# Patient Record
Sex: Female | Born: 1965 | Race: White | Hispanic: No | Smoking: Never smoker
Health system: Southern US, Community
[De-identification: ages and names within clinical notes are randomized; demographics above are authoritative.]

## PROBLEM LIST (undated history)

## (undated) DIAGNOSIS — D649 Anemia, unspecified: Secondary | ICD-10-CM

## (undated) DIAGNOSIS — Z9889 Other specified postprocedural states: Secondary | ICD-10-CM

## (undated) DIAGNOSIS — I10 Essential (primary) hypertension: Secondary | ICD-10-CM

## (undated) DIAGNOSIS — K219 Gastro-esophageal reflux disease without esophagitis: Secondary | ICD-10-CM

## (undated) DIAGNOSIS — R112 Nausea with vomiting, unspecified: Secondary | ICD-10-CM

## (undated) HISTORY — PX: TUBAL LIGATION: SHX77

---

## 2003-09-14 ENCOUNTER — Emergency Department (HOSPITAL_COMMUNITY): Admission: EM | Admit: 2003-09-14 | Discharge: 2003-09-14 | Payer: Self-pay | Admitting: Emergency Medicine

## 2010-07-21 ENCOUNTER — Ambulatory Visit (HOSPITAL_COMMUNITY)
Admission: RE | Admit: 2010-07-21 | Discharge: 2010-07-21 | Payer: Self-pay | Source: Home / Self Care | Attending: Family Medicine | Admitting: Family Medicine

## 2010-09-11 ENCOUNTER — Other Ambulatory Visit (HOSPITAL_COMMUNITY): Payer: Self-pay | Admitting: Family Medicine

## 2010-09-11 DIAGNOSIS — D649 Anemia, unspecified: Secondary | ICD-10-CM

## 2010-09-11 DIAGNOSIS — N920 Excessive and frequent menstruation with regular cycle: Secondary | ICD-10-CM

## 2010-09-14 ENCOUNTER — Ambulatory Visit (HOSPITAL_COMMUNITY)
Admission: RE | Admit: 2010-09-14 | Discharge: 2010-09-14 | Disposition: A | Discharge status: Home / Self Care | Payer: Medicaid Other | Source: Ambulatory Visit | Attending: Family Medicine | Admitting: Family Medicine

## 2010-09-14 DIAGNOSIS — R9389 Abnormal findings on diagnostic imaging of other specified body structures: Secondary | ICD-10-CM | POA: Insufficient documentation

## 2010-09-14 DIAGNOSIS — N920 Excessive and frequent menstruation with regular cycle: Secondary | ICD-10-CM | POA: Insufficient documentation

## 2010-09-14 DIAGNOSIS — D649 Anemia, unspecified: Secondary | ICD-10-CM | POA: Insufficient documentation

## 2010-09-15 ENCOUNTER — Other Ambulatory Visit (HOSPITAL_COMMUNITY): Payer: Self-pay | Admitting: Family Medicine

## 2010-09-15 DIAGNOSIS — N920 Excessive and frequent menstruation with regular cycle: Secondary | ICD-10-CM

## 2010-09-15 DIAGNOSIS — D649 Anemia, unspecified: Secondary | ICD-10-CM

## 2010-12-15 ENCOUNTER — Ambulatory Visit (HOSPITAL_COMMUNITY): Payer: Self-pay

## 2011-09-13 ENCOUNTER — Inpatient Hospital Stay (HOSPITAL_COMMUNITY)
Admission: EM | Admit: 2011-09-13 | Discharge: 2011-09-15 | DRG: 440 | Disposition: A | Discharge status: Home / Self Care | Payer: Self-pay | Attending: Internal Medicine | Admitting: Internal Medicine

## 2011-09-13 ENCOUNTER — Emergency Department (HOSPITAL_COMMUNITY): Payer: Self-pay

## 2011-09-13 ENCOUNTER — Encounter (HOSPITAL_COMMUNITY): Payer: Self-pay | Admitting: *Deleted

## 2011-09-13 DIAGNOSIS — K859 Acute pancreatitis without necrosis or infection, unspecified: Principal | ICD-10-CM | POA: Diagnosis present

## 2011-09-13 DIAGNOSIS — I10 Essential (primary) hypertension: Secondary | ICD-10-CM | POA: Diagnosis present

## 2011-09-13 DIAGNOSIS — K219 Gastro-esophageal reflux disease without esophagitis: Secondary | ICD-10-CM | POA: Diagnosis present

## 2011-09-13 DIAGNOSIS — D649 Anemia, unspecified: Secondary | ICD-10-CM | POA: Diagnosis present

## 2011-09-13 DIAGNOSIS — E785 Hyperlipidemia, unspecified: Secondary | ICD-10-CM | POA: Diagnosis present

## 2011-09-13 DIAGNOSIS — F1010 Alcohol abuse, uncomplicated: Secondary | ICD-10-CM | POA: Diagnosis present

## 2011-09-13 HISTORY — DX: Anemia, unspecified: D64.9

## 2011-09-13 HISTORY — DX: Essential (primary) hypertension: I10

## 2011-09-13 HISTORY — DX: Gastro-esophageal reflux disease without esophagitis: K21.9

## 2011-09-13 LAB — URINALYSIS, ROUTINE W REFLEX MICROSCOPIC
Bilirubin Urine: NEGATIVE
Ketones, ur: NEGATIVE mg/dL
Specific Gravity, Urine: 1.01 (ref 1.005–1.030)
Urobilinogen, UA: 0.2 mg/dL (ref 0.0–1.0)

## 2011-09-13 LAB — CBC
Hemoglobin: 13.3 g/dL (ref 12.0–15.0)
MCH: 30.4 pg (ref 26.0–34.0)
Platelets: 299 10*3/uL (ref 150–400)
RBC: 4.37 MIL/uL (ref 3.87–5.11)
WBC: 10.6 10*3/uL — ABNORMAL HIGH (ref 4.0–10.5)

## 2011-09-13 LAB — COMPREHENSIVE METABOLIC PANEL
ALT: 24 U/L (ref 0–35)
Alkaline Phosphatase: 61 U/L (ref 39–117)
BUN: 9 mg/dL (ref 6–23)
CO2: 28 mEq/L (ref 19–32)
Chloride: 101 mEq/L (ref 96–112)
GFR calc Af Amer: >90 mL/min (ref >90)
GFR calc non Af Amer: >90 mL/min (ref >90)
Glucose, Bld: 118 mg/dL — ABNORMAL HIGH (ref 70–99)
Potassium: 3.8 mEq/L (ref 3.5–5.1)
Sodium: 138 mEq/L (ref 135–145)
Total Bilirubin: 0.2 mg/dL — ABNORMAL LOW (ref 0.3–1.2)

## 2011-09-13 LAB — POCT PREGNANCY, URINE: Preg Test, Ur: NEGATIVE

## 2011-09-13 LAB — LIPASE, BLOOD: Lipase: 468 U/L — ABNORMAL HIGH (ref 11–59)

## 2011-09-13 LAB — DIFFERENTIAL
Lymphocytes Relative: 18 % (ref 12–46)
Lymphs Abs: 1.9 10*3/uL (ref 0.7–4.0)
Monocytes Relative: 8 % (ref 3–12)
Neutrophils Relative %: 72 % (ref 43–77)

## 2011-09-13 MED ORDER — HYDROCODONE-ACETAMINOPHEN 5-325 MG PO TABS: 1.0000 | ORAL_TABLET | ORAL | Status: DC | PRN

## 2011-09-13 MED ORDER — ACETAMINOPHEN 325 MG PO TABS
650.0000 mg | ORAL_TABLET | Freq: Four times a day (QID) | ORAL | Status: DC | PRN
  Administered 2011-09-14 – 2011-09-15 (×2): 650 mg via ORAL
  Filled 2011-09-13 (×2): qty 2

## 2011-09-13 MED ORDER — ALUM & MAG HYDROXIDE-SIMETH 200-200-20 MG/5ML PO SUSP: 30.0000 mL | Freq: Four times a day (QID) | ORAL | Status: DC | PRN

## 2011-09-13 MED ORDER — SODIUM CHLORIDE 0.9 % IJ SOLN
INTRAMUSCULAR | Status: AC
  Administered 2011-09-13: 3 mL
  Filled 2011-09-13: qty 3

## 2011-09-13 MED ORDER — ACETAMINOPHEN 650 MG RE SUPP: 650.0000 mg | Freq: Four times a day (QID) | RECTAL | Status: DC | PRN

## 2011-09-13 MED ORDER — PANTOPRAZOLE SODIUM 40 MG IV SOLR
40.0000 mg | INTRAVENOUS | Status: DC
  Administered 2011-09-13 – 2011-09-14 (×2): 40 mg via INTRAVENOUS
  Filled 2011-09-13 (×2): qty 40

## 2011-09-13 MED ORDER — HYDROMORPHONE HCL PF 1 MG/ML IJ SOLN
1.0000 mg | INTRAMUSCULAR | Status: DC | PRN
  Administered 2011-09-13 – 2011-09-14 (×2): 1 mg via INTRAVENOUS
  Filled 2011-09-13 (×2): qty 1

## 2011-09-13 MED ORDER — ONDANSETRON HCL 4 MG/2ML IJ SOLN
4.0000 mg | Freq: Once | INTRAMUSCULAR | Status: AC
  Administered 2011-09-13: 4 mg via INTRAVENOUS
  Filled 2011-09-13: qty 2

## 2011-09-13 MED ORDER — ONDANSETRON HCL 4 MG/2ML IJ SOLN
4.0000 mg | Freq: Four times a day (QID) | INTRAMUSCULAR | Status: DC | PRN
  Administered 2011-09-14: 4 mg via INTRAVENOUS
  Filled 2011-09-13: qty 2

## 2011-09-13 MED ORDER — SODIUM CHLORIDE 0.9 % IV SOLN
INTRAVENOUS | Status: DC
  Administered 2011-09-13 – 2011-09-14 (×2): 1000 mL via INTRAVENOUS

## 2011-09-13 MED ORDER — ONDANSETRON HCL 4 MG PO TABS: 4.0000 mg | ORAL_TABLET | Freq: Four times a day (QID) | ORAL | Status: DC | PRN

## 2011-09-13 MED ORDER — SODIUM CHLORIDE 0.9 % IV SOLN
INTRAVENOUS | Status: DC
  Administered 2011-09-13 – 2011-09-15 (×4): via INTRAVENOUS

## 2011-09-13 MED ORDER — SODIUM CHLORIDE 0.9 % IJ SOLN
INTRAMUSCULAR | Status: AC
  Administered 2011-09-13: 1 mL
  Filled 2011-09-13: qty 3

## 2011-09-13 MED ORDER — HYDROMORPHONE HCL PF 1 MG/ML IJ SOLN
1.0000 mg | Freq: Once | INTRAMUSCULAR | Status: AC
  Administered 2011-09-13: 1 mg via INTRAVENOUS
  Filled 2011-09-13: qty 1

## 2011-09-13 NOTE — Plan of Care (Signed)
Problem: Problem: Pain Management Progression Goal: Pain controlled Outcome: Not Progressing Pt c/o # 6   cramping/aching of upper abdomen on admission to 335. Goal: Pain Controlled (History of Chronic Pain) Outcome: Not Progressing Upper abdomen pain 3 days ago with n/v/d. Pt dismissed this as stomach virus.  Abdominal pain returned 09/12/11 @ 2200 with dry heaves nausea. Then came to ED At 0800.

## 2011-09-13 NOTE — ED Provider Notes (Signed)
Medical screening examination/treatment/procedure(s) were performed by non-physician practitioner and as supervising physician I was immediately available for consultation/collaboration.   Dayton Bailiff, MD 09/13/11 1510

## 2011-09-13 NOTE — ED Provider Notes (Signed)
History     CSN: [id number]  Arrival date & time 09/13/11  0759   None     Chief Complaint  Patient presents with  . Abdominal Pain    HPI Cassandra Bennett is a 46 y.o. female who presents to the ED for abdominal pain. The pain started approximately 10:00 pm last night. Prior to that ate hamburger and french fries at approximately 6 pm. Continued to have pain all night. The pain is located over the entire abdomen. Unable to localize one area that is worse than another. Nausea this morning. Denies fever or chills, diarrhea or constipation. Decreased appetite today.  Past Medical History  Diagnosis Date  . Hypertension   . Anemia   . Acid reflux     Past Surgical History  Procedure Date  . Cesarean section     No family history on file.  History  Substance Use Topics  . Smoking status: Never Smoker   . Smokeless tobacco: Not on file  . Alcohol Use: No    OB History    Grav Para Term Preterm Abortions TAB SAB Ect Mult Living                  Review of Systems  Constitutional: Positive for appetite change. Negative for fever, chills, diaphoresis and fatigue.  HENT: Negative for ear pain, congestion, sore throat, facial swelling, neck pain, neck stiffness, dental problem and sinus pressure.   Eyes: Negative for photophobia, pain and discharge.  Respiratory: Negative for cough, chest tightness and wheezing.   Cardiovascular: Negative.   Gastrointestinal: Positive for nausea and abdominal pain. Negative for vomiting, diarrhea, constipation and abdominal distention.  Genitourinary: Positive for pelvic pain. Negative for dysuria, urgency, frequency, flank pain, decreased urine volume, vaginal bleeding, vaginal discharge, difficulty urinating and vaginal pain.  Musculoskeletal: Positive for back pain. Negative for myalgias and gait problem.  Skin: Negative for color change and rash.  Neurological: Negative for dizziness, speech difficulty, weakness, light-headedness,  numbness and headaches.  Psychiatric/Behavioral: Negative for confusion and agitation. The patient is not nervous/anxious.     Allergies  Review of patient's allergies indicates no known allergies.  Home Medications  No current outpatient prescriptions on file.  BP 155/87  Pulse 74  Temp(Src) 97.9 F (36.6 C) (Oral)  Resp 20  Ht 5\' 4"  (1.626 m)  Wt 175 lb (79.379 kg)  BMI 30.04 kg/m2  SpO2 100%  LMP 09/06/2011  Physical Exam  Nursing note and vitals reviewed. Constitutional: She is oriented to person, place, and time. She appears well-developed and well-nourished.  HENT:  Head: Normocephalic.  Eyes: EOM are normal.  Neck: Neck supple.  Cardiovascular: Normal rate.   Pulmonary/Chest: Effort normal and breath sounds normal.  Abdominal: Soft. There is tenderness in the right upper quadrant and epigastric area. There is rebound and positive Murphy's sign. There is no CVA tenderness.  Musculoskeletal: Normal range of motion.  Neurological: She is alert and oriented to person, place, and time. No cranial nerve deficit.  Skin: Skin is warm and dry.  Psychiatric: She has a normal mood and affect. Her behavior is normal. Judgment and thought content normal.   Results for orders placed during the hospital encounter of 09/13/11 (from the past 24 hour(s))  URINALYSIS, ROUTINE W REFLEX MICROSCOPIC     Status: Abnormal   Collection Time   09/13/11  8:31 AM      Component Value Range   Color, Urine YELLOW  YELLOW    APPearance CLEAR  CLEAR    Specific Gravity, Urine 1.010  1.005 - 1.030    pH 7.5  5.0 - 8.0    Glucose, UA NEGATIVE  NEGATIVE (mg/dL)   Hgb urine dipstick SMALL (*) NEGATIVE    Bilirubin Urine NEGATIVE  NEGATIVE    Ketones, ur NEGATIVE  NEGATIVE (mg/dL)   Protein, ur NEGATIVE  NEGATIVE (mg/dL)   Urobilinogen, UA 0.2  0.0 - 1.0 (mg/dL)   Nitrite NEGATIVE  NEGATIVE    Leukocytes, UA NEGATIVE  NEGATIVE   URINE MICROSCOPIC-ADD ON     Status: Abnormal   Collection Time     09/13/11  8:31 AM      Component Value Range   RBC / HPF 0-2  <3 (RBC/hpf)   Bacteria, UA FEW (*) RARE   CBC     Status: Abnormal   Collection Time   09/13/11  8:33 AM      Component Value Range   WBC 10.6 (*) 4.0 - 10.5 (K/uL)   RBC 4.37  3.87 - 5.11 (MIL/uL)   Hemoglobin 13.3  12.0 - 15.0 (g/dL)   HCT 16.1  09.6 - 04.5 (%)   MCV 87.2  78.0 - 100.0 (fL)   MCH 30.4  26.0 - 34.0 (pg)   MCHC 34.9  30.0 - 36.0 (g/dL)   RDW 40.9  81.1 - 91.4 (%)   Platelets 299  150 - 400 (K/uL)  DIFFERENTIAL     Status: Normal   Collection Time   09/13/11  8:33 AM      Component Value Range   Neutrophils Relative 72  43 - 77 (%)   Neutro Abs 7.7  1.7 - 7.7 (K/uL)   Lymphocytes Relative 18  12 - 46 (%)   Lymphs Abs 1.9  0.7 - 4.0 (K/uL)   Monocytes Relative 8  3 - 12 (%)   Monocytes Absolute 0.8  0.1 - 1.0 (K/uL)   Eosinophils Relative 1  0 - 5 (%)   Eosinophils Absolute 0.2  0.0 - 0.7 (K/uL)   Basophils Relative 0  0 - 1 (%)   Basophils Absolute 0.0  0.0 - 0.1 (K/uL)  COMPREHENSIVE METABOLIC PANEL     Status: Abnormal   Collection Time   09/13/11  8:33 AM      Component Value Range   Sodium 138  135 - 145 (mEq/L)   Potassium 3.8  3.5 - 5.1 (mEq/L)   Chloride 101  96 - 112 (mEq/L)   CO2 28  19 - 32 (mEq/L)   Glucose, Bld 118 (*) 70 - 99 (mg/dL)   BUN 9  6 - 23 (mg/dL)   Creatinine, Ser 7.82  0.50 - 1.10 (mg/dL)   Calcium 9.5  8.4 - 95.6 (mg/dL)   Total Protein 7.2  6.0 - 8.3 (g/dL)   Albumin 3.7  3.5 - 5.2 (g/dL)   AST 23  0 - 37 (U/L)   ALT 24  0 - 35 (U/L)   Alkaline Phosphatase 61  39 - 117 (U/L)   Total Bilirubin 0.2 (*) 0.3 - 1.2 (mg/dL)   GFR calc non Af Amer >90  >90 (mL/min)   GFR calc Af Amer >90  >90 (mL/min)  LIPASE, BLOOD     Status: Abnormal   Collection Time   09/13/11  8:33 AM      Component Value Range   Lipase 468 (*) 11 - 59 (U/L)  AMYLASE     Status: Abnormal   Collection Time   09/13/11  8:33 AM      Component Value Range   Amylase 126 (*) 0 - 105 (U/L)  POCT  PREGNANCY, URINE     Status: Normal   Collection Time   09/13/11  8:34 AM      Component Value Range   Preg Test, Ur NEGATIVE  NEGATIVE    US Abdomen Complete  09/13/2011  *RADIOLOGY REPORT*  Clinical Data:  Right upper quadrant pain, nausea  COMPLETE ABDOMINAL ULTRASOUND  Comparison:  None.  Findings:  Gallbladder:  No gallstones, gallbladder wall thickening, or pericholecystic fluid.  Negative sonographic Murphy's sign.  Common bile duct:  Measures 5 mm.  Liver:  Hyperechoic hepatic parenchyma, compatible with hepatic steatosis.  Irregular hypoechoic parenchyma in the gallbladder fossa, favored to reflect focal fatty sparing.  IVC:  Appears normal.  Pancreas:  Incomplete visualized but grossly unremarkable.  Spleen:  Measures 6.4 cm.  Right Kidney:  Measures 11.9 cm.  No mass or hydronephrosis.  Left Kidney:  Measures 12.6 cm.  No mass or hydronephrosis.  Abdominal aorta:  No aneurysm identified.  IMPRESSION: Hepatic steatosis.  Suspected focal fat sparing in the gallbladder fossa.  Normal sonographic appearance of the gallbladder.  Original Report Authenticated By: Charline Bills, M.D.   Assessment: Abdominal pain with elevated Lipase and Amylase  Plan:  Admit   Continue IV fluids and pain medication and Zofran as needed  ED Course: discussed with Dr. Brooke Dare, EDP, will admit  Procedures   MDM: Spoke with Admission team. Will admit to Team 1. Dr. Sherrie Mustache attending.          [facility] Orlene Och, [place] 09/13/11 1102

## 2011-09-13 NOTE — ED Notes (Signed)
Awaiting meds from pharmacy  

## 2011-09-13 NOTE — H&P (Signed)
History and Physical       Hospital Admission Note Date: 09/13/2011  Patient name: Cassandra Bennett Medical record number: [id number] Date of birth: [birth year] Age: 46 y.o. Gender: female PCP: No primary provider on file.  Attending physician: Elliot Cousin, MD   Chief Complaint:  Abdominal pain for last 2-3 days, worse over last night  HPI: Patient is a 46 year old Caucasian female with history of hypertension, hyperlipidemia, alcohol use (per patient, drinks occasionally), anemia presented to the ED for abdominal pain. History was obtained from the patient who stated that she was having epigastric and upper abdominal pain for last 2-3 days, radiating to the back, intermittent for last 2-3 days however it improved on its own. Since last night, around 10 PM patient's abdominal pain worsened to 10 out of 10 intensity with nausea but no vomiting fevers or chills. Patient also reported that she ate hamburger, french fries and had wine yesterday prior to the abdominal pain.  Review of Systems:  Constitutional: Denies fever, chills, diaphoresis, and fatigue.  she reports some loss of appetite secondary to abdominal pain HEENT: Denies photophobia, eye pain, redness, hearing loss, ear pain, congestion, sore throat, rhinorrhea, sneezing, mouth sores, trouble swallowing, neck pain, neck stiffness and tinnitus.   Respiratory: Denies SOB, DOE, cough, chest tightness,  and wheezing.   Cardiovascular: Denies chest pain, palpitations and leg swelling.  Gastrointestinal: See history of present illness, denies any diarrhea, constipation, blood in stool and abdominal distention.  Genitourinary: Denies dysuria, urgency, frequency, hematuria, flank pain and difficulty urinating.  Musculoskeletal: Denies myalgias, back pain, joint swelling, arthralgias and gait problem.  Skin: Denies pallor, rash and wound.  Neurological: Denies dizziness, seizures,  syncope, weakness, light-headedness, numbness and headaches.  Hematological: Denies adenopathy. Easy bruising, personal or family bleeding history  Psychiatric/Behavioral: Denies suicidal ideation, mood changes, confusion, nervousness, sleep disturbance and agitation  Past Medical History: Past Medical History  Diagnosis Date  . Hypertension   . Anemia   . Acid reflux    Past Surgical History  Procedure Date  . Cesarean section     Medications: Prior to Admission medications   Medication Sig Start Date End Date Taking? Authorizing Provider  ferrous sulfate 325 (65 FE) MG tablet Take 325 mg by mouth 2 (two) times daily.   Yes Historical Provider, MD  fish oil-omega-3 fatty acids 1000 MG capsule Take 1 g by mouth every morning.   Yes Historical Provider, MD  lisinopril-hydrochlorothiazide (PRINZIDE,ZESTORETIC) 10-12.5 MG per tablet Take 1 tablet by mouth at bedtime.   Yes Historical Provider, MD  omeprazole (PRILOSEC) 20 MG capsule Take 20 mg by mouth daily before breakfast.   Yes Historical Provider, MD    Allergies:  No Known Allergies  Social History:  reports that she has never smoked. She does not have any smokeless tobacco history on file. She reports that she does drinks alcohol occasionally. She reports that she is not a heavy drinker.  Family History: No family history on file.  Physical Exam: Blood pressure 105/59, pulse 63, temperature 97.9 F (36.6 C), temperature source Oral, resp. rate 16, height 5\' 4"  (1.626 m), weight 79.379 kg (175 lb), last menstrual period 09/06/2011, SpO2 98.00%. General: Alert, awake, oriented x3, in no acute distress. HEENT: anicteric sclera, pink conjunctiva, pupils equal and reactive to light and accomodation Neck: supple, no masses or lymphadenopathy, no goiter, no bruits  Heart: Regular rate and rhythm, without murmurs, rubs or gallops. Lungs: Clear to auscultation bilaterally, no wheezing, rales or rhonchi. Abdomen:  Tender in right  upper quadrant, epigastric and left upper quadrant area, soft, NBS, no rebound tenderness Extremities: No clubbing, cyanosis or edema with positive pedal pulses. Neuro: Grossly intact, no focal neurological deficits, strength 5/5 upper and lower extremities bilaterally Psych: alert and oriented x 3, normal mood and affect Skin: no rashes or lesions, warm and dry   LABS on Admission:  Basic Metabolic Panel:  Lab 09/13/11 1610  NA 138  K 3.8  CL 101  CO2 28  GLUCOSE 118*  BUN 9  CREATININE 0.56  CALCIUM 9.5  MG --  PHOS --   Liver Function Tests:  Lab 09/13/11 0833  AST 23  ALT 24  ALKPHOS 61  BILITOT 0.2*  PROT 7.2  ALBUMIN 3.7    Lab 09/13/11 0833  LIPASE 468*  AMYLASE 126*    CBC:  Lab 09/13/11 0833  WBC 10.6*  NEUTROABS 7.7  HGB 13.3  HCT 38.1  MCV 87.2  PLT 299     Radiological Exams on Admission: US Abdomen Complete  09/13/2011  *RADIOLOGY REPORT*  Clinical Data:  Right upper quadrant pain, nausea  COMPLETE ABDOMINAL ULTRASOUND  Comparison:  None.  Findings:  Gallbladder:  No gallstones, gallbladder wall thickening, or pericholecystic fluid.  Negative sonographic Murphy's sign.  Common bile duct:  Measures 5 mm.  Liver:  Hyperechoic hepatic parenchyma, compatible with hepatic steatosis.  Irregular hypoechoic parenchyma in the gallbladder fossa, favored to reflect focal fatty sparing.  IVC:  Appears normal.  Pancreas:  Incomplete visualized but grossly unremarkable.  Spleen:  Measures 6.4 cm.  Right Kidney:  Measures 11.9 cm.  No mass or hydronephrosis.  Left Kidney:  Measures 12.6 cm.  No mass or hydronephrosis.  Abdominal aorta:  No aneurysm identified.  IMPRESSION: Hepatic steatosis.  Suspected focal fat sparing in the gallbladder fossa.  Normal sonographic appearance of the gallbladder.  Original Report Authenticated By: Charline Bills, M.D.    Assessment/Plan  .Pancreatitis, acute: Likely exacerbated due to alcohol use, rule out hypertriglyceridemia.  No acute cholecystitis on right upper quadrant ultrasound done today - Keep n.p.o. status, IV fluids, pain control - Obtain lipid panel, lipase in a.m.   Marland KitchenHTN (hypertension): BP borderline, hold by mouth antihypertensives - Patient is on lisinopril/HCTZ outpatient, HCTZ will be discontinued   .Alcohol use, episodic: Patient reports that she is not a heavy drinker  - Monitor for any withdrawals   .Hyperlipemia: Obtain lipid panel to rule out hypertriglyceridemia - Patient is not on any statins outpatient  .GERD (gastroesophageal reflux disease): - Place on IV PPI for now   .Anemia: H&H stable  DVT prophylaxis: SCDs  CODE STATUS: Full code  Further plan will depend as patient's clinical course evolves and further radiologic and laboratory data become available.   @Time  Spent on Admission: 1 hour Schuyler Olden M.D. [facility] 09/13/2011, 11:40 AM

## 2011-09-13 NOTE — ED Notes (Signed)
Right sided abd pain radiating into right upper back that started last night with periods of nausea.  Denies v/d

## 2011-09-14 LAB — BASIC METABOLIC PANEL
Calcium: 8.6 mg/dL (ref 8.4–10.5)
GFR calc Af Amer: >90 mL/min (ref >90)
GFR calc non Af Amer: >90 mL/min (ref >90)
Glucose, Bld: 109 mg/dL — ABNORMAL HIGH (ref 70–99)
Sodium: 136 mEq/L (ref 135–145)

## 2011-09-14 LAB — CBC
MCH: 30.6 pg (ref 26.0–34.0)
MCHC: 34.9 g/dL (ref 30.0–36.0)
Platelets: 282 10*3/uL (ref 150–400)
RBC: 4.08 MIL/uL (ref 3.87–5.11)

## 2011-09-14 LAB — LIPID PANEL: LDL Cholesterol: 142 mg/dL — ABNORMAL HIGH (ref 0–99)

## 2011-09-14 LAB — LIPASE, BLOOD: Lipase: 106 U/L — ABNORMAL HIGH (ref 11–59)

## 2011-09-14 MED ORDER — ENOXAPARIN SODIUM 40 MG/0.4ML ~~LOC~~ SOLN
40.0000 mg | SUBCUTANEOUS | Status: DC
  Administered 2011-09-14 – 2011-09-15 (×2): 40 mg via SUBCUTANEOUS
  Filled 2011-09-14 (×2): qty 0.4

## 2011-09-14 NOTE — Progress Notes (Signed)
Subjective: Still complaining of some epigastric discomfort Denies nausea Has been by the bedside, discussed all pertinent labs   Objective: Vital signs in last 24 hours: Filed Vitals:   09/13/11 1500 09/13/11 2004 09/13/11 2136 09/14/11 0555  BP: 112/72 130/78 132/84 128/86  Pulse: 60 70 68 57  Temp: 98 F (36.7 C) 98 F (36.7 C) 97.8 F (36.6 C) 98.2 F (36.8 C)  TempSrc: Oral Oral Oral Oral  Resp: 18 18 18 18   Height:   5\' 4"  (1.626 m)   Weight:   81 kg (178 lb 9.2 oz)   SpO2: 99% 99% 97% 95%    Intake/Output Summary (Last 24 hours) at 09/14/11 1131 Last data filed at 09/14/11 9604  Gross per 24 hour  Intake      0 ml  Output      0 ml  Net      0 ml    Weight change:   General: Alert, awake, oriented x3, in no acute distress.  HEENT: anicteric sclera, pink conjunctiva, pupils equal and reactive to light and accomodation  Neck: supple, no masses or lymphadenopathy, no goiter, no bruits  Heart: Regular rate and rhythm, without murmurs, rubs or gallops.  Lungs: Clear to auscultation bilaterally, no wheezing, rales or rhonchi.  Abdomen: Tender in right upper quadrant, epigastric and left upper quadrant area, soft, NBS, no rebound tenderness  Extremities: No clubbing, cyanosis or edema with positive pedal pulses.  Neuro: Grossly intact, no focal neurological deficits, strength 5/5 upper and lower extremities bilaterally  Psych: alert and oriented x 3, normal mood and affect  Skin: no rashes or lesions, warm and dry   Lab Results: Results for orders placed during the hospital encounter of 09/13/11 (from the past 24 hour(s))  BASIC METABOLIC PANEL     Status: Abnormal   Collection Time   09/14/11  5:41 AM      Component Value Range   Sodium 136  135 - 145 (mEq/L)   Potassium 3.5  3.5 - 5.1 (mEq/L)   Chloride 104  96 - 112 (mEq/L)   CO2 25  19 - 32 (mEq/L)   Glucose, Bld 109 (*) 70 - 99 (mg/dL)   BUN 10  6 - 23 (mg/dL)   Creatinine, Ser 5.40  0.50 - 1.10 (mg/dL)     Calcium 8.6  8.4 - 10.5 (mg/dL)   GFR calc non Af Amer >90  >90 (mL/min)   GFR calc Af Amer >90  >90 (mL/min)  CBC     Status: Abnormal   Collection Time   09/14/11  5:41 AM      Component Value Range   WBC 8.0  4.0 - 10.5 (K/uL)   RBC 4.08  3.87 - 5.11 (MIL/uL)   Hemoglobin 12.5  12.0 - 15.0 (g/dL)   HCT 98.1 (*) 19.1 - 46.0 (%)   MCV 87.7  78.0 - 100.0 (fL)   MCH 30.6  26.0 - 34.0 (pg)   MCHC 34.9  30.0 - 36.0 (g/dL)   RDW 47.8  29.5 - 62.1 (%)   Platelets 282  150 - 400 (K/uL)  LIPASE, BLOOD     Status: Abnormal   Collection Time   09/14/11  5:41 AM      Component Value Range   Lipase 106 (*) 11 - 59 (U/L)  AMYLASE     Status: Normal   Collection Time   09/14/11  5:41 AM      Component Value Range   Amylase 59  0 - 105 (U/L)  LIPID PANEL     Status: Abnormal   Collection Time   09/14/11  5:41 AM      Component Value Range   Cholesterol 206 (*) 0 - 200 (mg/dL)   Triglycerides 960  <454 (mg/dL)   HDL 38 (*) >09 (mg/dL)   Total CHOL/HDL Ratio 5.4     VLDL 26  0 - 40 (mg/dL)   LDL Cholesterol 811 (*) 0 - 99 (mg/dL)     Micro: No results found for this or any previous visit (from the past 240 hour(s)).  Studies/Results: US Abdomen Complete  09/13/2011  *RADIOLOGY REPORT*  Clinical Data:  Right upper quadrant pain, nausea  COMPLETE ABDOMINAL ULTRASOUND  Comparison:  None.  Findings:  Gallbladder:  No gallstones, gallbladder wall thickening, or pericholecystic fluid.  Negative sonographic Murphy's sign.  Common bile duct:  Measures 5 mm.  Liver:  Hyperechoic hepatic parenchyma, compatible with hepatic steatosis.  Irregular hypoechoic parenchyma in the gallbladder fossa, favored to reflect focal fatty sparing.  IVC:  Appears normal.  Pancreas:  Incomplete visualized but grossly unremarkable.  Spleen:  Measures 6.4 cm.  Right Kidney:  Measures 11.9 cm.  No mass or hydronephrosis.  Left Kidney:  Measures 12.6 cm.  No mass or hydronephrosis.  Abdominal aorta:  No aneurysm  identified.  IMPRESSION: Hepatic steatosis.  Suspected focal fat sparing in the gallbladder fossa.  Normal sonographic appearance of the gallbladder.  Original Report Authenticated By: Charline Bills, M.D.    Medications:  Scheduled Meds:   . pantoprazole (PROTONIX) IV  40 mg Intravenous Q24H  . sodium chloride      . sodium chloride       Continuous Infusions:   . sodium chloride 125 mL/hr at 09/13/11 0858  . sodium chloride 1,000 mL (09/14/11 0615)   PRN Meds:.acetaminophen, acetaminophen, alum & mag hydroxide-simeth, HYDROcodone-acetaminophen, HYDROmorphone, ondansetron (ZOFRAN) IV, ondansetron   Assessment: Principal Problem:  *Pancreatitis, acute Active Problems:  HTN (hypertension)  Alcohol abuse, episodic  Hyperlipemia  GERD (gastroesophageal reflux disease)  Anemia   Plan:  .Pancreatitis, acute: Likely exacerbated due to alcohol use, workup negative for hypertriglyceridemia. No acute cholecystitis on right upper quadrant ultrasound , simply shows hepatic steatosis, possible etiology could be HCTZ, viral, denies heavy alcohol use- Keep n.p.o. status, IV fluids, pain control  - Obtain lipid panel, lipase in a.m.   Marland KitchenHTN (hypertension): BP borderline, hold by mouth antihypertensives  - Patient is on lisinopril/HCTZ outpatient, HCTZ will be discontinued   .Alcohol use, episodic: Patient reports that she is not a heavy drinker  - Monitor for any withdrawals   .Hyperlipemia: Obtain lipid panel to rule out hypertriglyceridemia  - Patient is not on any statins outpatient   .GERD (gastroesophageal reflux disease):  - Place on IV PPI for now   .Anemia: H&H stable  DVT prophylaxis: SCDs  Advance diet in the morning, still quite tender to palpation.   LOS: 1 day   [facility] 09/14/2011, 11:31 AM

## 2011-09-15 MED ORDER — HYDROCODONE-ACETAMINOPHEN 5-500 MG PO TABS: 1.0000 | ORAL_TABLET | Freq: Four times a day (QID) | ORAL | Status: AC | PRN

## 2011-09-15 MED ORDER — PROMETHAZINE HCL 12.5 MG PO TABS: 12.5000 mg | ORAL_TABLET | Freq: Four times a day (QID) | ORAL | Status: AC | PRN

## 2011-09-15 NOTE — Discharge Summary (Signed)
Physician Discharge Summary  Patient ID: Cassandra Bennett MRN: [id number] DOB/AGE: Sep 08, 1965 46 y.o.  Admit date: 09/13/2011 Discharge date: 09/15/2011  Discharge Diagnoses:  Principal Problem:  *Pancreatitis, acute Active Problems:  HTN (hypertension)  Alcohol abuse, episodic  Hyperlipemia  GERD (gastroesophageal reflux disease)  Anemia   Medication List  As of 09/15/2011  1:23 PM   STOP taking these medications         lisinopril-hydrochlorothiazide 10-12.5 MG per tablet         TAKE these medications         ferrous sulfate 325 (65 FE) MG tablet   Take 325 mg by mouth 2 (two) times daily.      fish oil-omega-3 fatty acids 1000 MG capsule   Take 1 g by mouth every morning.      HYDROcodone-acetaminophen 5-500 MG per tablet   Commonly known as: VICODIN   Take 1 tablet by mouth every 6 (six) hours as needed for pain.      omeprazole 20 MG capsule   Commonly known as: PRILOSEC   Take 20 mg by mouth daily before breakfast.      promethazine 12.5 MG tablet   Commonly known as: PHENERGAN   Take 1 tablet (12.5 mg total) by mouth every 6 (six) hours as needed for nausea.            Discharge Orders    Future Orders Please Complete By Expires   Diet - low sodium heart healthy      Activity as tolerated - No restrictions         Follow-up Information    Follow up with [facility]. (2-4 weeks to check blood pressure)    Contact information:   [address] [facility] [place] [address] [phone]          Disposition: home  Discharged Condition: Stable  Consults:   none  Labs:   Results for orders placed during the hospital encounter of 09/13/11 (from the past 48 hour(s))  BASIC METABOLIC PANEL     Status: Abnormal   Collection Time   09/14/11  5:41 AM      Component Value Range Comment   Sodium 136  135 - 145 (mEq/L)    Potassium 3.5  3.5 - 5.1 (mEq/L)    Chloride 104  96 - 112 (mEq/L)    CO2 25  19 - 32 (mEq/L)    Glucose,  Bld 109 (*) 70 - 99 (mg/dL)    BUN 10  6 - 23 (mg/dL)    Creatinine, Ser 2.95  0.50 - 1.10 (mg/dL)    Calcium 8.6  8.4 - 10.5 (mg/dL)    GFR calc non Af Amer >90  >90 (mL/min)    GFR calc Af Amer >90  >90 (mL/min)   CBC     Status: Abnormal   Collection Time   09/14/11  5:41 AM      Component Value Range Comment   WBC 8.0  4.0 - 10.5 (K/uL)    RBC 4.08  3.87 - 5.11 (MIL/uL)    Hemoglobin 12.5  12.0 - 15.0 (g/dL)    HCT 28.4 (*) 13.2 - 46.0 (%)    MCV 87.7  78.0 - 100.0 (fL)    MCH 30.6  26.0 - 34.0 (pg)    MCHC 34.9  30.0 - 36.0 (g/dL)    RDW 44.0  10.2 - 72.5 (%)    Platelets 282  150 - 400 (K/uL)  LIPASE, BLOOD     Status: Abnormal   Collection Time   09/14/11  5:41 AM      Component Value Range Comment   Lipase 106 (*) 11 - 59 (U/L)   AMYLASE     Status: Normal   Collection Time   09/14/11  5:41 AM      Component Value Range Comment   Amylase 59  0 - 105 (U/L)   LIPID PANEL     Status: Abnormal   Collection Time   09/14/11  5:41 AM      Component Value Range Comment   Cholesterol 206 (*) 0 - 200 (mg/dL)    Triglycerides 454  <150 (mg/dL)    HDL 38 (*) >09 (mg/dL)    Total CHOL/HDL Ratio 5.4      VLDL 26  0 - 40 (mg/dL)    LDL Cholesterol 811 (*) 0 - 99 (mg/dL)     Diagnostics:  US Abdomen Complete  09/13/2011  *RADIOLOGY REPORT*  Clinical Data:  Right upper quadrant pain, nausea  COMPLETE ABDOMINAL ULTRASOUND  Comparison:  None.  Findings:  Gallbladder:  No gallstones, gallbladder wall thickening, or pericholecystic fluid.  Negative sonographic Murphy's sign.  Common bile duct:  Measures 5 mm.  Liver:  Hyperechoic hepatic parenchyma, compatible with hepatic steatosis.  Irregular hypoechoic parenchyma in the gallbladder fossa, favored to reflect focal fatty sparing.  IVC:  Appears normal.  Pancreas:  Incomplete visualized but grossly unremarkable.  Spleen:  Measures 6.4 cm.  Right Kidney:  Measures 11.9 cm.  No mass or hydronephrosis.  Left Kidney:  Measures 12.6 cm.  No mass  or hydronephrosis.  Abdominal aorta:  No aneurysm identified.  IMPRESSION: Hepatic steatosis.  Suspected focal fat sparing in the gallbladder fossa.  Normal sonographic appearance of the gallbladder.  Original Report Authenticated By: Charline Bills, M.D.    Full Code   Hospital Course: See H&P for complete admission details. The patient is a 46 year old white female who presented with epigastric pain radiating to the back. She had nausea as well. She drinks occasionally but denies drinking to excess. She had normal vital signs. A soft abdomen which was tender in the upper abdominal area. Lipase was elevated to 468. Amylase was 126.  She was admitted and started on bowel rest, IV fluids, pain medication. Ultrasound showed no cholelithiasis. Triglyceride level was not high. Patient remained normotensive off her antihypertensives. It's possible that her pancreatitis was medication related. Cannot rule out alcohol-induced, though patient and husband report that she does not drink heavily. By the time of discharge, she was tolerating a solid diet and requiring no pain or nausea medication per she may followup with the health department for blood pressure check in a few weeks. Total time on the day of discharge greater than 30 minutes.  Discharge Exam:  Blood pressure 110/68, pulse 68, temperature 97.7 F (36.5 C), temperature source Oral, resp. rate 18, height 5\' 4"  (1.626 m), weight 81 kg (178 lb 9.2 oz), last menstrual period 09/06/2011, SpO2 98.00%.  General: Comfortable. Cooperative. Lungs clear to auscultation bilaterally without wheeze rhonchi or rales Cardiovascular regular rate rhythm without murmurs gallops rubs Abdomen soft nontender nondistended   Signed: Seham Gardenhire L 09/15/2011, 1:23 PM

## 2011-09-15 NOTE — Progress Notes (Signed)
D/c instructions given with prescriptions and patient verbalizes understanding of instructions. IV cath removed without difficulty and cath intact.

## 2011-09-20 ENCOUNTER — Telehealth (HOSPITAL_COMMUNITY): Payer: Self-pay | Admitting: Dietician

## 2011-09-21 NOTE — Telephone Encounter (Signed)
Mailed informational handouts from the [employer] re: low fat diet to pt home on 09/20/2011.

## 2014-01-09 ENCOUNTER — Other Ambulatory Visit: Payer: Self-pay | Admitting: Nurse Practitioner

## 2014-01-09 ENCOUNTER — Ambulatory Visit (INDEPENDENT_AMBULATORY_CARE_PROVIDER_SITE_OTHER): Payer: Medicaid Other | Admitting: Nurse Practitioner

## 2014-01-09 ENCOUNTER — Encounter: Payer: Self-pay | Admitting: Nurse Practitioner

## 2014-01-09 VITALS — BP 156/94 | Ht 64.5 in | Wt 163.0 lb

## 2014-01-09 DIAGNOSIS — Z79899 Other long term (current) drug therapy: Secondary | ICD-10-CM

## 2014-01-09 DIAGNOSIS — Z1151 Encounter for screening for human papillomavirus (HPV): Secondary | ICD-10-CM

## 2014-01-09 DIAGNOSIS — E785 Hyperlipidemia, unspecified: Secondary | ICD-10-CM

## 2014-01-09 DIAGNOSIS — I10 Essential (primary) hypertension: Secondary | ICD-10-CM

## 2014-01-09 DIAGNOSIS — K219 Gastro-esophageal reflux disease without esophagitis: Secondary | ICD-10-CM

## 2014-01-09 DIAGNOSIS — L578 Other skin changes due to chronic exposure to nonionizing radiation: Secondary | ICD-10-CM

## 2014-01-09 DIAGNOSIS — Z124 Encounter for screening for malignant neoplasm of cervix: Secondary | ICD-10-CM

## 2014-01-09 DIAGNOSIS — D509 Iron deficiency anemia, unspecified: Secondary | ICD-10-CM

## 2014-01-09 DIAGNOSIS — Z113 Encounter for screening for infections with a predominantly sexual mode of transmission: Secondary | ICD-10-CM

## 2014-01-09 DIAGNOSIS — Z01419 Encounter for gynecological examination (general) (routine) without abnormal findings: Secondary | ICD-10-CM

## 2014-01-09 DIAGNOSIS — Z0000 Encounter for general adult medical examination without abnormal findings: Secondary | ICD-10-CM

## 2014-01-09 MED ORDER — METOPROLOL SUCCINATE ER 50 MG PO TB24: 50.0000 mg | ORAL_TABLET | Freq: Every day | ORAL | Status: DC

## 2014-01-09 MED ORDER — OMEPRAZOLE 20 MG PO CPDR: 20.0000 mg | DELAYED_RELEASE_CAPSULE | Freq: Every day | ORAL | Status: DC

## 2014-01-11 LAB — PAP IG, CT-NG NAA, HPV HIGH-RISK
CHLAMYDIA PROBE AMP: NEGATIVE
GC PROBE AMP: NEGATIVE
HPV DNA High Risk: NOT DETECTED

## 2014-01-14 ENCOUNTER — Telehealth: Payer: Self-pay | Admitting: Family Medicine

## 2014-01-14 ENCOUNTER — Ambulatory Visit (HOSPITAL_COMMUNITY)
Admission: RE | Admit: 2014-01-14 | Discharge: 2014-01-14 | Disposition: A | Discharge status: Home / Self Care | Payer: Medicaid Other | Source: Ambulatory Visit | Attending: Nurse Practitioner | Admitting: Nurse Practitioner

## 2014-01-14 DIAGNOSIS — Z01419 Encounter for gynecological examination (general) (routine) without abnormal findings: Secondary | ICD-10-CM

## 2014-01-14 DIAGNOSIS — Z1231 Encounter for screening mammogram for malignant neoplasm of breast: Secondary | ICD-10-CM | POA: Diagnosis present

## 2014-01-14 NOTE — Telephone Encounter (Signed)
Patient wants a copy of the pap smear that she recently had done. Please call when done.

## 2014-01-15 ENCOUNTER — Encounter: Payer: Self-pay | Admitting: Nurse Practitioner

## 2014-01-15 NOTE — Telephone Encounter (Signed)
Notified patient.

## 2014-01-15 NOTE — Progress Notes (Signed)
Subjective:    Patient ID: Cassandra Bennett, female    DOB: [birth year], 48 y.o.   MRN: [id number]  HPI presents for her wellness exam. Was on a blood pressure medication prescribed at the health department. Unsure about the name. Regular menstrual cycles, heavy flow with clots the first 2 days total of 5 days. Needs an eye exam. Regular dental care. Has had the same sexual partner but has been separated and concerned about STDs. Last intercourse was about a month ago.  Review of Systems  Constitutional: Negative for fever, activity change, appetite change and fatigue.  HENT: Negative for dental problem, ear pain, sinus pressure and sore throat.   Respiratory: Negative for cough, chest tightness, shortness of breath and wheezing.   Cardiovascular: Negative for chest pain and leg swelling.  Gastrointestinal: Negative for nausea, vomiting, abdominal pain, diarrhea, constipation, blood in stool and abdominal distention.  Genitourinary: Positive for menstrual problem. Negative for dysuria, urgency, frequency, vaginal discharge, enuresis, difficulty urinating, genital sores and pelvic pain.       Objective:   Physical Exam  Vitals reviewed. Constitutional: She is oriented to person, place, and time. She appears well-developed. No distress.  HENT:  Right Ear: External ear normal.  Left Ear: External ear normal.  Mouth/Throat: Oropharynx is clear and moist.  Neck: Normal range of motion. Neck supple. No tracheal deviation present. No thyromegaly present.  Cardiovascular: Normal rate, regular rhythm and normal heart sounds.  Exam reveals no gallop.   No murmur heard. Pulmonary/Chest: Effort normal and breath sounds normal.  Abdominal: Soft. She exhibits no distension. There is no tenderness.  Genitourinary: Vagina normal and uterus normal. No vaginal discharge found.  External GU: No rashes or lesions noted. Vagina no discharge. Cervix normal limit in appearance. No CMT. Bimanual exam no  tenderness or obvious masses.  Musculoskeletal: She exhibits no edema.  Lymphadenopathy:    She has no cervical adenopathy.  Neurological: She is alert and oriented to person, place, and time.  Skin: Skin is warm and dry. No rash noted.  Extensive sun damage noted.  Psychiatric: She has a normal mood and affect. Her behavior is normal.   Breast exam: Multiple fine nodularity, no dominant masses; axillae no adenopathy.        Assessment & Plan:   Problem List Items Addressed This Visit     Cardiovascular and Mediastinum   HTN (hypertension)   Relevant Medications      metoprolol succinate (TOPROL-XL) 24 hr tablet   Other Relevant Orders      Basic metabolic panel     Digestive   GERD (gastroesophageal reflux disease)   Relevant Medications      omeprazole (PRILOSEC) capsule     Other   Hyperlipemia   Relevant Medications      metoprolol succinate (TOPROL-XL) 24 hr tablet   Other Relevant Orders      Lipid panel   Anemia   Relevant Orders      CBC with Differential      TSH      Vit D  25 hydroxy (rtn osteoporosis monitoring)    Other Visit Diagnoses   Well woman exam    -  Primary    Relevant Orders       Pap IG, CT/NG NAA, and HPV (high risk) (Completed)       MM DIGITAL SCREENING BILATERAL    Screening for cervical cancer        Relevant Orders  Pap IG, CT/NG NAA, and HPV (high risk) (Completed)       HIV antibody       Hepatitis C antibody       RPR    Screen for STD (sexually transmitted disease)        Relevant Orders       Pap IG, CT/NG NAA, and HPV (high risk) (Completed)    Screening for HPV (human papillomavirus)        Relevant Orders       Pap IG, CT/NG NAA, and HPV (high risk) (Completed)    Encounter for long-term (current) use of other medications        Relevant Orders       Hepatic function panel      Meds ordered this encounter  Medications  . omeprazole (PRILOSEC) 20 MG capsule    Sig: Take 1 capsule (20 mg total) by mouth daily  before breakfast.    Dispense:  30 capsule    Refill:  5    Order Specific Question:  Supervising Provider    Answer:  Merlyn Albert [[id number]]  . metoprolol succinate (TOPROL-XL) 50 MG 24 hr tablet    Sig: Take 1 tablet (50 mg total) by mouth daily. Take with or immediately following a meal.    Dispense:  30 tablet    Refill:  5    Order Specific Question:  Supervising Provider    Answer:  Riccardo Dubin   Refer to dermatology for skin cancer screening due to extensive sun damage. Recommend healthy diet and regular activity was daily vitamin D and calcium supplementation. Discussed safe sex issues. Next physical in one year. Return in about 3 months (around 04/11/2014).

## 2014-01-16 ENCOUNTER — Other Ambulatory Visit: Payer: Self-pay | Admitting: Nurse Practitioner

## 2014-01-16 ENCOUNTER — Other Ambulatory Visit: Payer: Self-pay

## 2014-01-16 DIAGNOSIS — D649 Anemia, unspecified: Secondary | ICD-10-CM

## 2014-01-16 LAB — BASIC METABOLIC PANEL
BUN: 12 mg/dL (ref 6–23)
CALCIUM: 8.7 mg/dL (ref 8.4–10.5)
CO2: 25 mEq/L (ref 19–32)
Chloride: 106 mEq/L (ref 96–112)
Creat: 0.59 mg/dL (ref 0.50–1.10)
GLUCOSE: 93 mg/dL (ref 70–99)
POTASSIUM: 4.5 meq/L (ref 3.5–5.3)
SODIUM: 139 meq/L (ref 135–145)

## 2014-01-16 LAB — CBC WITH DIFFERENTIAL/PLATELET
Basophils Absolute: 0.1 10*3/uL (ref 0.0–0.1)
Basophils Relative: 1 % (ref 0–1)
EOS ABS: 0.1 10*3/uL (ref 0.0–0.7)
Eosinophils Relative: 2 % (ref 0–5)
HCT: 26.8 % — ABNORMAL LOW (ref 36.0–46.0)
HEMOGLOBIN: 8.1 g/dL — AB (ref 12.0–15.0)
LYMPHS ABS: 1.9 10*3/uL (ref 0.7–4.0)
LYMPHS PCT: 27 % (ref 12–46)
MCH: 19.7 pg — ABNORMAL LOW (ref 26.0–34.0)
MCHC: 30.2 g/dL (ref 30.0–36.0)
MCV: 65.2 fL — ABNORMAL LOW (ref 78.0–100.0)
MONOS PCT: 7 % (ref 3–12)
Monocytes Absolute: 0.5 10*3/uL (ref 0.1–1.0)
NEUTROS ABS: 4.4 10*3/uL (ref 1.7–7.7)
NEUTROS PCT: 63 % (ref 43–77)
PLATELETS: 326 10*3/uL (ref 150–400)
RBC: 4.11 MIL/uL (ref 3.87–5.11)
RDW: 16.7 % — ABNORMAL HIGH (ref 11.5–15.5)
WBC: 7 10*3/uL (ref 4.0–10.5)

## 2014-01-16 LAB — LIPID PANEL
CHOL/HDL RATIO: 4.2 ratio
CHOLESTEROL: 178 mg/dL (ref 0–200)
HDL: 42 mg/dL (ref >39)
LDL Cholesterol: 123 mg/dL — ABNORMAL HIGH (ref 0–99)
TRIGLYCERIDES: 64 mg/dL (ref <150)
VLDL: 13 mg/dL (ref 0–40)

## 2014-01-16 LAB — HEPATIC FUNCTION PANEL
AST: 11 U/L (ref 0–37)
Albumin: 4.1 g/dL (ref 3.5–5.2)
Alkaline Phosphatase: 51 U/L (ref 39–117)
BILIRUBIN DIRECT: 0.1 mg/dL (ref 0.0–0.3)
Indirect Bilirubin: 0.3 mg/dL (ref 0.2–1.2)
TOTAL PROTEIN: 6.6 g/dL (ref 6.0–8.3)
Total Bilirubin: 0.4 mg/dL (ref 0.2–1.2)

## 2014-01-16 LAB — HIV ANTIBODY (ROUTINE TESTING W REFLEX): HIV 1&2 Ab, 4th Generation: NONREACTIVE

## 2014-01-16 LAB — RPR

## 2014-01-16 LAB — HEPATITIS C ANTIBODY: HCV AB: NEGATIVE

## 2014-01-16 LAB — TSH: TSH: 1.307 u[IU]/mL (ref 0.350–4.500)

## 2014-01-16 LAB — VITAMIN D 25 HYDROXY (VIT D DEFICIENCY, FRACTURES): VIT D 25 HYDROXY: 52 ng/mL (ref 30–89)

## 2014-01-17 LAB — FERRITIN: FERRITIN: 4 ng/mL — AB (ref 10–291)

## 2014-01-17 LAB — IRON AND TIBC: UIBC: 479 ug/dL — AB (ref 125–400)

## 2014-01-18 ENCOUNTER — Other Ambulatory Visit: Payer: Self-pay | Admitting: *Deleted

## 2014-01-18 DIAGNOSIS — D649 Anemia, unspecified: Secondary | ICD-10-CM

## 2014-01-18 NOTE — Addendum Note (Signed)
Addended by: Metro KungICHARDS, WENDY M on: 01/18/2014 10:00 AM   Modules accepted: Orders

## 2014-01-18 NOTE — Addendum Note (Signed)
Addended byOneal Deputy: Nachum Derossett D on: 01/18/2014 10:18 AM   Modules accepted: Orders

## 2014-01-18 NOTE — Progress Notes (Signed)
Patient notified and verbalized understanding. 

## 2014-01-25 ENCOUNTER — Encounter: Payer: Self-pay | Admitting: Family Medicine

## 2014-03-05 LAB — CBC WITH DIFFERENTIAL/PLATELET
Basophils Absolute: 0.1 10*3/uL (ref 0.0–0.1)
Basophils Relative: 1 % (ref 0–1)
Eosinophils Absolute: 0.2 10*3/uL (ref 0.0–0.7)
Eosinophils Relative: 2 % (ref 0–5)
HCT: 37.4 % (ref 36.0–46.0)
Hemoglobin: 12.3 g/dL (ref 12.0–15.0)
LYMPHS PCT: 25 % (ref 12–46)
Lymphs Abs: 2.1 10*3/uL (ref 0.7–4.0)
MCH: 26.2 pg (ref 26.0–34.0)
MCHC: 32.9 g/dL (ref 30.0–36.0)
MCV: 79.6 fL (ref 78.0–100.0)
Monocytes Absolute: 0.5 10*3/uL (ref 0.1–1.0)
Monocytes Relative: 6 % (ref 3–12)
NEUTROS PCT: 66 % (ref 43–77)
Neutro Abs: 5.5 10*3/uL (ref 1.7–7.7)
PLATELETS: 346 10*3/uL (ref 150–400)
RBC: 4.7 MIL/uL (ref 3.87–5.11)
WBC: 8.3 10*3/uL (ref 4.0–10.5)

## 2014-03-05 LAB — RETICULOCYTES
ABS RETIC: 61.1 10*3/uL (ref 19.0–186.0)
RBC.: 4.7 MIL/uL (ref 3.87–5.11)
RETIC CT PCT: 1.3 % (ref 0.4–2.3)

## 2014-04-10 ENCOUNTER — Encounter: Payer: Self-pay | Admitting: Nurse Practitioner

## 2014-04-10 ENCOUNTER — Ambulatory Visit (INDEPENDENT_AMBULATORY_CARE_PROVIDER_SITE_OTHER): Payer: Medicaid Other | Admitting: Nurse Practitioner

## 2014-04-10 VITALS — BP 132/90 | Ht 64.5 in | Wt 164.0 lb

## 2014-04-10 DIAGNOSIS — I10 Essential (primary) hypertension: Secondary | ICD-10-CM

## 2014-04-10 DIAGNOSIS — N920 Excessive and frequent menstruation with regular cycle: Secondary | ICD-10-CM

## 2014-04-10 DIAGNOSIS — D500 Iron deficiency anemia secondary to blood loss (chronic): Secondary | ICD-10-CM

## 2014-04-10 NOTE — Patient Instructions (Signed)
Depo Provera Nexplanon Mirena Endometrial ablation

## 2014-04-11 ENCOUNTER — Ambulatory Visit: Payer: Medicaid Other | Admitting: Nurse Practitioner

## 2014-04-14 ENCOUNTER — Encounter: Payer: Self-pay | Admitting: Nurse Practitioner

## 2014-04-14 NOTE — Progress Notes (Signed)
Subjective:  Presents for recheck see previous note. Taking metoprolol without any difficulty. Continues to have very heavy regular menses. Taking daily iron therapy. Is interested in some form of intervention for her cycles.  Objective:   BP 132/90  Ht 5' 4.5" (1.638 m)  Wt 164 lb (74.39 kg)  BMI 27.73 kg/m2 NAD. Alert, oriented. Lungs clear. Heart regular rate rhythm. See labs dated 03/04/14. These were reviewed with patient.  Assessment:  Problem List Items Addressed This Visit     Cardiovascular and Mediastinum   HTN (hypertension) - Primary     Other   Anemia   Relevant Orders      Ambulatory referral to Gynecology    Other Visit Diagnoses   Menorrhagia with regular cycle        Relevant Orders       Ambulatory referral to Gynecology      Plan: Discussed multiple options. Patient is interested in endometrial ablation if she is a candidate. Refer to gynecology for further evaluation. Return if symptoms worsen or fail to improve.

## 2014-05-06 ENCOUNTER — Ambulatory Visit (INDEPENDENT_AMBULATORY_CARE_PROVIDER_SITE_OTHER): Payer: Medicaid Other | Admitting: Obstetrics & Gynecology

## 2014-05-06 ENCOUNTER — Encounter: Payer: Self-pay | Admitting: Obstetrics & Gynecology

## 2014-05-06 VITALS — BP 140/100 | Ht 64.0 in | Wt 168.0 lb

## 2014-05-06 DIAGNOSIS — N946 Dysmenorrhea, unspecified: Secondary | ICD-10-CM

## 2014-05-06 DIAGNOSIS — N920 Excessive and frequent menstruation with regular cycle: Secondary | ICD-10-CM

## 2014-05-06 MED ORDER — MEGESTROL ACETATE 40 MG PO TABS: 40.0000 mg | ORAL_TABLET | Freq: Every day | ORAL | Status: DC

## 2014-05-06 NOTE — Progress Notes (Signed)
Patient ID: Cassandra Bennett, female   DOB: 01-01-1966, 48 y.o.   MRN: [id number] Referred from Drs Luking's  Chief Complaint  Patient presents with  . Referral    endometrial ablation.    HPI Heavy periods for a decade, 5 days with 3 days heavy, also with severe cramping which starts about 1 week prior Wears tampons pads together, soils clothes and sheets  ROS No burning with urination, frequency or urgency No nausea, vomiting or diarrhea Nor fever chills or other constitutional symptoms  Current outpatient prescriptions: ferrous fumarate (FERRO-SEQUELS) 50 MG CR tablet, Take 50 mg by mouth 3 (three) times daily with meals., Disp: , Rfl: ;  metoprolol succinate (TOPROL-XL) 50 MG 24 hr tablet, Take 1 tablet (50 mg total) by mouth daily. Take with or immediately following a meal., Disp: 30 tablet, Rfl: 5;  Omega-3 Fatty Acids (FISH OIL) 600 MG CAPS, Take by mouth., Disp: , Rfl:  omeprazole (PRILOSEC) 20 MG capsule, Take 1 capsule (20 mg total) by mouth daily before breakfast., Disp: 30 capsule, Rfl: 5   Past Medical History  Diagnosis Date  . Hypertension   . Anemia   . Acid reflux     Past Surgical History  Procedure Laterality Date  . Cesarean section      OB History    No data available      No Known Allergies  History   Social History  . Marital Status: Legally Separated    Spouse Name: N/A    Number of Children: N/A  . Years of Education: N/A   Social History Main Topics  . Smoking status: Never Smoker   . Smokeless tobacco: Never Used  . Alcohol Use: Yes     Comment: RARE SOCIAL  . Drug Use: No  . Sexual Activity: Not Currently    Birth Control/ Protection: Surgical   Other Topics Concern  . None   Social History Narrative    Family History  Problem Relation Age of Onset  . Heart disease Mother     MI  . Heart disease Father     MI  . Cancer Sister 40    BREAST     Blood pressure 140/100, height 5\' 4"  (1.626 m), weight 168 lb (76.204 kg),  last menstrual period 04/18/2014.  EXAM No exam paps all been normal  Assessment/Plan:  Menorrhagia Dysmenorrhea  Will do sonogram Megestrol therapy  Decide if ablation

## 2014-05-13 ENCOUNTER — Telehealth: Payer: Self-pay | Admitting: Family Medicine

## 2014-05-13 MED ORDER — METOPROLOL SUCCINATE ER 50 MG PO TB24: 50.0000 mg | ORAL_TABLET | Freq: Every day | ORAL | Status: DC

## 2014-05-13 NOTE — Telephone Encounter (Signed)
Ok lets do, nurse write ill sign

## 2014-05-13 NOTE — Telephone Encounter (Signed)
Pt's Medicaid covers NAME BRAND ONLY of her metoprolol succinate (TOPROL-XL) 50 MG 24 hr tablet, [facility] [facility]PENSE AS WRITTEN & BRAND MEDICALLY NECESSARY so that Medicaid will cover  See denial, form, & copy of preferred list in green folder

## 2014-05-14 ENCOUNTER — Telehealth: Payer: Self-pay | Admitting: Family Medicine

## 2014-05-14 NOTE — Telephone Encounter (Signed)
Pt calling to state that [facility] still sent in for generic an medicaid denied Can we call them to correct this script please  metoprolol succinate (TOPROL-XL) 50 MG 24 hr tablet

## 2014-05-14 NOTE — Telephone Encounter (Signed)
Spoke with pharmacy. They said that as of 05/11/14, Medicaid will no longer pay for brand name, they will only cover generic. Pt notified and verbalized understanding.

## 2014-06-06 ENCOUNTER — Encounter: Payer: Self-pay | Admitting: Obstetrics & Gynecology

## 2014-06-06 ENCOUNTER — Other Ambulatory Visit: Payer: Self-pay | Admitting: Obstetrics & Gynecology

## 2014-06-06 ENCOUNTER — Ambulatory Visit: Payer: Medicaid Other | Admitting: Obstetrics & Gynecology

## 2014-06-06 ENCOUNTER — Ambulatory Visit (INDEPENDENT_AMBULATORY_CARE_PROVIDER_SITE_OTHER): Payer: Medicaid Other | Admitting: Obstetrics & Gynecology

## 2014-06-06 ENCOUNTER — Other Ambulatory Visit: Payer: Medicaid Other

## 2014-06-06 ENCOUNTER — Ambulatory Visit (INDEPENDENT_AMBULATORY_CARE_PROVIDER_SITE_OTHER): Payer: Medicaid Other

## 2014-06-06 VITALS — BP 150/100 | Wt 170.0 lb

## 2014-06-06 DIAGNOSIS — N946 Dysmenorrhea, unspecified: Secondary | ICD-10-CM

## 2014-06-06 DIAGNOSIS — N920 Excessive and frequent menstruation with regular cycle: Secondary | ICD-10-CM

## 2014-06-06 DIAGNOSIS — N840 Polyp of corpus uteri: Secondary | ICD-10-CM

## 2014-06-06 NOTE — Progress Notes (Signed)
Patient ID: Cassandra Bennett, female   DOB: [birth year], 48 y.o.   MRN: [id number] Pt has has heavy irregular bleeding  No bleeding on megestrol No problems at all   [place] Transvaginal Non-ob  06/06/2014   GYNECOLOGIC SONOGRAM   Cassandra Bennett is a 48 y.o. G3P2A1  for a pelvic sonogram for  menorrhagia and dysmenorrhea. Pt is s/p C/S x2 and an BTL. Pt is currently  taking Megace and hasn't had a period since beginning the medicine.  Uterus                      9.9 x 7.8 x 6.3 cm, anteverted with multiple  small fibroids noted within largest=1.8cm, cavity of fluid noted that  seems to connect with previous C/S site (see pics)  Endometrium          22 mm, asymetrical with an ?polyp noted within,  (hyperechoic mass with +Doppler flow noted within)= 22.245mm  Right ovary             3.0 x 2.0 x 1.8 cm,   Left ovary                2.3 x 2.2 x 1.7 cm,   No free fluid or adnexal masses noted within the pelvis  Technician Comments:  Anteverted uterus with multiple fibroids noted within,cavity of fluid  noted that seems to connect with previous C/S site (see pics) Endometrium  asymmetrical with ?polyp noted within=22.715mm bilateral ovaries appear WNL,  no free fluid or adnexal masses noted within the pelvis   Cassandra Bennett, Cassandra Bennett 06/06/2014 2:22 PM  Clinical Impression and recommendations:  I have reviewed the sonogram results above, combined with the patient's  current clinical course, below are my impressions and any appropriate  recommendations for management based on the sonographic findings.  Endometrial mass, polyp vs myoma  Otherwise normal gyn sonogram   Cassandra Bennett 06/06/2014 3:11 PM     Plan hysteroscopy uterine curettage removal of endometrial polyp on 07/10/2014 Stay on megestrol

## 2014-07-02 ENCOUNTER — Other Ambulatory Visit: Payer: Self-pay | Admitting: Obstetrics & Gynecology

## 2014-07-02 NOTE — Patient Instructions (Addendum)
Cassandra Bennett  07/02/2014   Your procedure is scheduled on:  07/10/2014  Report to [facility] at 10:00 AM.  Call this number if you have problems the morning of surgery: [phone]   Remember:   Do not eat food or drink liquids after midnight.   Take these medicines the morning of surgery with A SIP OF WATER:  Megace, Metoprolol, Prilosec, Iron   Do not wear jewelry, make-up or nail polish.  Do not wear lotions, powders, or perfumes. You may wear deodorant.  Do not shave 48 hours prior to surgery. Men may shave face and neck.  Do not bring valuables to the hospital.  [facility] is not responsible for any belongings or valuables.               Contacts, dentures or bridgework may not be worn into surgery.  Leave suitcase in the car. After surgery it may be brought to your room.  For patients admitted to the hospital, discharge time is determined by your treatment team.               Patients discharged the day of surgery will not be allowed to drive home.  Name and phone number of your driver:   Special Instructions: Shower using CHG 2 nights before surgery and the night before surgery.  If you shower the day of surgery use CHG.  Use special wash - you have one bottle of CHG for all showers.  You should use approximately 1/3 of the bottle for each shower.   Please read over the following fact sheets that you were given: Surgical Site Infection Prevention and Anesthesia Post-op Instructions   PATIENT INSTRUCTIONS POST-ANESTHESIA  IMMEDIATELY FOLLOWING SURGERY:  Do not drive or operate machinery for the first twenty four hours after surgery.  Do not make any important decisions for twenty four hours after surgery or while taking narcotic pain medications or sedatives.  If you develop intractable nausea and vomiting or a severe headache please notify your doctor immediately.  FOLLOW-UP:  Please make an appointment with your surgeon as instructed. You do not need to follow up with  anesthesia unless specifically instructed to do so.  WOUND CARE INSTRUCTIONS (if applicable):  Keep a dry clean dressing on the anesthesia/puncture wound site if there is drainage.  Once the wound has quit draining you may leave it open to air.  Generally you should leave the bandage intact for twenty four hours unless there is drainage.  If the epidural site drains for more than 36-48 hours please call the anesthesia department.  QUESTIONS?:  Please feel free to call your physician or the hospital operator if you have any questions, and they will be happy to assist you.      Endometrial Ablation Endometrial ablation removes the lining of the uterus (endometrium). It is usually a same-day, outpatient treatment. Ablation helps avoid major surgery, such as surgery to remove the cervix and uterus (hysterectomy). After endometrial ablation, you will have little or no menstrual bleeding and may not be able to have children. However, if you are premenopausal, you will need to use a reliable method of birth control following the procedure because of the small chance that pregnancy can occur. There are different reasons to have this procedure, which include:  Heavy periods.  Bleeding that is causing anemia.  Irregular bleeding.  Bleeding fibroids on the lining inside the uterus if they are smaller than 3 centimeters. This procedure should not be done if:  You want children in the future.  You have severe cramps with your menstrual period.  You have precancerous or cancerous cells in your uterus.  You were recently pregnant.  You have gone through menopause.  You have had major surgery on the uterus, such as a cesarean delivery. LET [facility] Lake[facility]OUR HEALTH CARE PROVIDER KNOW ABOUT:  Any allergies you have.  All medicines you are taking, including vitamins, herbs, eye drops, creams, and over-the-counter medicines.  Previous problems you or members of your family have had with the use of  anesthetics.  Any blood disorders you have.  Previous surgeries you have had.  Medical conditions you have. RISKS AND COMPLICATIONS  Generally, this is a safe procedure. However, as with any procedure, complications can occur. Possible complications include:  Perforation of the uterus.  Bleeding.  Infection of the uterus, bladder, or vagina.  Injury to surrounding organs.  An air bubble to the lung (air embolus).  Pregnancy following the procedure.  Failure of the procedure to help the problem, requiring hysterectomy.  Decreased ability to diagnose cancer in the lining of the uterus. BEFORE THE PROCEDURE  The lining of the uterus must be tested to make sure there is no pre-cancerous or cancer cells present.  An ultrasound may be performed to look at the size of the uterus and to check for abnormalities.  Medicines may be given to thin the lining of the uterus. PROCEDURE  During the procedure, your health care provider will use a tool called a resectoscope to help see inside your uterus. There are different ways to remove the lining of your uterus.   Radiofrequency - This method uses a radiofrequency-alternating electric current to remove the lining of the uterus.  Cryotherapy - This method uses extreme cold to freeze the lining of the uterus.  Heated-Free Liquid - This method uses heated salt (saline) solution to remove the lining of the uterus.  Microwave - This method uses high-energy microwaves to heat up the lining of the uterus to remove it.  Thermal balloon - This method involves inserting a catheter with a balloon tip into the uterus. The balloon tip is filled with heated fluid to remove the lining of the uterus. AFTER THE PROCEDURE  After your procedure, do not have sexual intercourse or insert anything into your vagina until permitted by your health care provider. After the procedure, you may experience:  Cramps.  Vaginal discharge.  Frequent  urination. Document Released: 04/30/2004 Document Revised: 02/21/2013 Document Reviewed: 11/22/2012 [facility] Patient Information 2015 [place], [place]. This information is not intended to replace advice given to you by your health care provider. Make sure you discuss any questions you have with your health care provider.  Endocervical Curettage Endocervical curettage is a procedure to obtain a tissue sample from your endocervical canal to look for abnormal cells. This procedure is sometimes done as part of an exam called a colposcopy, in which your health care provider takes a close look at the surface of your cervix because of an abnormal Pap test result or the presence of genital warts, bleeding, or pain. LET [facility] C[facility] PROVIDER KNOW ABOUT:  Recent vaginal infections you have had.  Recent menstrual periods or bleeding you have had.  Any allergies you have.  All medicines you are taking, including vitamins, herbs, eye drops, creams, and over-the-counter medicines.  Previous problems you or members of your family have had with the use of anesthetics.  Any blood disorders you have.  Previous surgeries you have had.  Medical conditions you have. RISKS AND COMPLICATIONS  Generally, endocervical curettage is a safe procedure. However, as with any procedure, complications can occur. Possible complications include:  Excessive bleeding.  Infection.  Injury to surrounding organs.  Allergic reaction to anesthetics or medicines. BEFORE THE PROCEDURE  Do not take aspirin or blood thinners for a week before the procedure. These can cause bleeding.  Do not douche or use tampons for at least 3 days before the procedure.  Do not have sexual intercourse for at least 3 days before the procedure.  Arrange for someone to take you home after the procedure. PROCEDURE   For this procedure, you will be asked to undress from the waist down. You will need to lie on an exam table with your  feet in stirrups. Your legs and belly will be covered with a sheet.  A warm metal or plastic instrument (speculum) will be placed in your vagina to keep it open and to allow your health care provider to see inside your vagina.  A medicine that numbs the area (local anesthetic) may be used.  A sharp curved instrument will be used to scrape cells from your cervix or endocervix.  Medicines may be put on the surface of the tissue to stop any bleeding.  The scraped tissue sample is sent to the lab to see if there are any abnormalities. AFTER THE PROCEDURE   You may have some mild cramping pain.  You will rest in the office or clinic until you are stable and feel ready to go home. Document Released: 03/30/2008 Document Revised: 02/21/2013 Document Reviewed: 01/18/2013 [facility]Care Patient Information 2015 [place], [place]. This information is not intended to replace advice given to you by your health care provider. Make sure you discuss any questions you have with your health care provider.

## 2014-07-03 ENCOUNTER — Other Ambulatory Visit: Payer: Self-pay

## 2014-07-03 ENCOUNTER — Encounter (HOSPITAL_COMMUNITY)
Admission: RE | Admit: 2014-07-03 | Discharge: 2014-07-03 | Disposition: A | Discharge status: Home / Self Care | Payer: Medicaid Other | Source: Ambulatory Visit | Attending: Obstetrics & Gynecology | Admitting: Obstetrics & Gynecology

## 2014-07-03 ENCOUNTER — Encounter (HOSPITAL_COMMUNITY): Payer: Self-pay

## 2014-07-03 DIAGNOSIS — Z01818 Encounter for other preprocedural examination: Secondary | ICD-10-CM | POA: Diagnosis not present

## 2014-07-03 HISTORY — DX: Other specified postprocedural states: Z98.890

## 2014-07-03 HISTORY — DX: Nausea with vomiting, unspecified: R11.2

## 2014-07-03 LAB — URINE MICROSCOPIC-ADD ON

## 2014-07-03 LAB — URINALYSIS, ROUTINE W REFLEX MICROSCOPIC
Bilirubin Urine: NEGATIVE
GLUCOSE, UA: NEGATIVE mg/dL
Ketones, ur: NEGATIVE mg/dL
Nitrite: NEGATIVE
Protein, ur: 30 mg/dL — AB
SPECIFIC GRAVITY, URINE: 1.025 (ref 1.005–1.030)
Urobilinogen, UA: 0.2 mg/dL (ref 0.0–1.0)
pH: 6.5 (ref 5.0–8.0)

## 2014-07-03 LAB — COMPREHENSIVE METABOLIC PANEL
ALT: 13 U/L (ref 0–35)
ANION GAP: 5 (ref 5–15)
AST: 14 U/L (ref 0–37)
Albumin: 3.9 g/dL (ref 3.5–5.2)
Alkaline Phosphatase: 51 U/L (ref 39–117)
BUN: 13 mg/dL (ref 6–23)
CHLORIDE: 108 meq/L (ref 96–112)
CO2: 26 mmol/L (ref 19–32)
Calcium: 9 mg/dL (ref 8.4–10.5)
Creatinine, Ser: 0.62 mg/dL (ref 0.50–1.10)
Glucose, Bld: 111 mg/dL — ABNORMAL HIGH (ref 70–99)
Potassium: 4.3 mmol/L (ref 3.5–5.1)
SODIUM: 139 mmol/L (ref 135–145)
Total Bilirubin: 0.6 mg/dL (ref 0.3–1.2)
Total Protein: 6.7 g/dL (ref 6.0–8.3)

## 2014-07-03 LAB — CBC
HEMATOCRIT: 39.6 % (ref 36.0–46.0)
HEMOGLOBIN: 13.3 g/dL (ref 12.0–15.0)
MCH: 30.5 pg (ref 26.0–34.0)
MCHC: 33.6 g/dL (ref 30.0–36.0)
MCV: 90.8 fL (ref 78.0–100.0)
Platelets: 279 10*3/uL (ref 150–400)
RBC: 4.36 MIL/uL (ref 3.87–5.11)
RDW: 11.9 % (ref 11.5–15.5)
WBC: 8.7 10*3/uL (ref 4.0–10.5)

## 2014-07-03 LAB — HCG, QUANTITATIVE, PREGNANCY: hCG, Beta Chain, Quant, S: <1 m[IU]/mL (ref <5)

## 2014-07-10 ENCOUNTER — Encounter (HOSPITAL_COMMUNITY)
Admission: RE | Disposition: A | Discharge status: Home / Self Care | Payer: Self-pay | Source: Ambulatory Visit | Attending: Obstetrics & Gynecology

## 2014-07-10 ENCOUNTER — Ambulatory Visit (HOSPITAL_COMMUNITY): Payer: Medicaid Other | Admitting: Anesthesiology

## 2014-07-10 ENCOUNTER — Ambulatory Visit (HOSPITAL_COMMUNITY)
Admission: RE | Admit: 2014-07-10 | Discharge: 2014-07-10 | Disposition: A | Discharge status: Home / Self Care | Payer: Medicaid Other | Source: Ambulatory Visit | Attending: Obstetrics & Gynecology | Admitting: Obstetrics & Gynecology

## 2014-07-10 ENCOUNTER — Encounter (HOSPITAL_COMMUNITY): Payer: Self-pay | Admitting: *Deleted

## 2014-07-10 DIAGNOSIS — E785 Hyperlipidemia, unspecified: Secondary | ICD-10-CM | POA: Diagnosis not present

## 2014-07-10 DIAGNOSIS — Z9889 Other specified postprocedural states: Secondary | ICD-10-CM | POA: Insufficient documentation

## 2014-07-10 DIAGNOSIS — N946 Dysmenorrhea, unspecified: Secondary | ICD-10-CM

## 2014-07-10 DIAGNOSIS — N921 Excessive and frequent menstruation with irregular cycle: Secondary | ICD-10-CM | POA: Diagnosis present

## 2014-07-10 DIAGNOSIS — I10 Essential (primary) hypertension: Secondary | ICD-10-CM | POA: Insufficient documentation

## 2014-07-10 DIAGNOSIS — N920 Excessive and frequent menstruation with regular cycle: Secondary | ICD-10-CM

## 2014-07-10 DIAGNOSIS — K219 Gastro-esophageal reflux disease without esophagitis: Secondary | ICD-10-CM | POA: Diagnosis not present

## 2014-07-10 DIAGNOSIS — Z79899 Other long term (current) drug therapy: Secondary | ICD-10-CM | POA: Diagnosis not present

## 2014-07-10 DIAGNOSIS — N840 Polyp of corpus uteri: Secondary | ICD-10-CM | POA: Diagnosis not present

## 2014-07-10 DIAGNOSIS — Z862 Personal history of diseases of the blood and blood-forming organs and certain disorders involving the immune mechanism: Secondary | ICD-10-CM | POA: Insufficient documentation

## 2014-07-10 HISTORY — PX: DILITATION & CURRETTAGE/HYSTROSCOPY WITH NOVASURE ABLATION: SHX5568

## 2014-07-10 SURGERY — DILATATION & CURETTAGE/HYSTEROSCOPY WITH NOVASURE ABLATION
Anesthesia: General

## 2014-07-10 MED ORDER — ONDANSETRON HCL 4 MG/2ML IJ SOLN: 4.0000 mg | Freq: Once | INTRAMUSCULAR | Status: DC | PRN

## 2014-07-10 MED ORDER — LIDOCAINE HCL 1 % IJ SOLN
INTRAMUSCULAR | Status: DC | PRN
  Administered 2014-07-10: 35 mg via INTRADERMAL

## 2014-07-10 MED ORDER — NEOSTIGMINE METHYLSULFATE 10 MG/10ML IV SOLN
INTRAVENOUS | Status: DC | PRN
  Administered 2014-07-10: 1 mg via INTRAVENOUS

## 2014-07-10 MED ORDER — ROCURONIUM BROMIDE 100 MG/10ML IV SOLN
INTRAVENOUS | Status: DC | PRN
  Administered 2014-07-10: 15 mg via INTRAVENOUS
  Administered 2014-07-10: 10 mg via INTRAVENOUS

## 2014-07-10 MED ORDER — FENTANYL CITRATE 0.05 MG/ML IJ SOLN
INTRAMUSCULAR | Status: AC
  Filled 2014-07-10: qty 2

## 2014-07-10 MED ORDER — GLYCOPYRROLATE 0.2 MG/ML IJ SOLN
INTRAMUSCULAR | Status: AC
  Filled 2014-07-10: qty 2

## 2014-07-10 MED ORDER — MIDAZOLAM HCL 5 MG/5ML IJ SOLN
INTRAMUSCULAR | Status: DC | PRN
  Administered 2014-07-10: 2 mg via INTRAVENOUS

## 2014-07-10 MED ORDER — MIDAZOLAM HCL 2 MG/2ML IJ SOLN
INTRAMUSCULAR | Status: AC
  Filled 2014-07-10: qty 2

## 2014-07-10 MED ORDER — LIDOCAINE HCL (PF) 1 % IJ SOLN
INTRAMUSCULAR | Status: AC
  Filled 2014-07-10: qty 5

## 2014-07-10 MED ORDER — CEFAZOLIN SODIUM-DEXTROSE 2-3 GM-% IV SOLR
2.0000 g | INTRAVENOUS | Status: AC
  Administered 2014-07-10: 2 g via INTRAVENOUS
  Filled 2014-07-10: qty 50

## 2014-07-10 MED ORDER — DEXAMETHASONE SODIUM PHOSPHATE 4 MG/ML IJ SOLN
4.0000 mg | Freq: Once | INTRAMUSCULAR | Status: AC
  Administered 2014-07-10: 4 mg via INTRAVENOUS
  Filled 2014-07-10: qty 1

## 2014-07-10 MED ORDER — GLYCOPYRROLATE 0.2 MG/ML IJ SOLN
INTRAMUSCULAR | Status: DC | PRN
  Administered 2014-07-10: 0.4 mg via INTRAVENOUS

## 2014-07-10 MED ORDER — ONDANSETRON HCL 8 MG PO TABS: 8.0000 mg | ORAL_TABLET | Freq: Three times a day (TID) | ORAL | Status: DC | PRN

## 2014-07-10 MED ORDER — KETOROLAC TROMETHAMINE 30 MG/ML IJ SOLN
30.0000 mg | Freq: Once | INTRAMUSCULAR | Status: AC
  Administered 2014-07-10: 30 mg via INTRAVENOUS
  Filled 2014-07-10: qty 1

## 2014-07-10 MED ORDER — MIDAZOLAM HCL 2 MG/2ML IJ SOLN
1.0000 mg | INTRAMUSCULAR | Status: DC | PRN
  Administered 2014-07-10: 2 mg via INTRAVENOUS
  Filled 2014-07-10: qty 2

## 2014-07-10 MED ORDER — LACTATED RINGERS IV SOLN
INTRAVENOUS | Status: DC
  Administered 2014-07-10: 09:00:00 via INTRAVENOUS

## 2014-07-10 MED ORDER — 0.9 % SODIUM CHLORIDE (POUR BTL) OPTIME
TOPICAL | Status: DC | PRN
  Administered 2014-07-10: 1000 mL

## 2014-07-10 MED ORDER — SCOPOLAMINE 1 MG/3DAYS TD PT72
1.0000 | MEDICATED_PATCH | Freq: Once | TRANSDERMAL | Status: DC
  Administered 2014-07-10: 1.5 mg via TRANSDERMAL
  Filled 2014-07-10: qty 1

## 2014-07-10 MED ORDER — SUCCINYLCHOLINE CHLORIDE 20 MG/ML IJ SOLN
INTRAMUSCULAR | Status: DC | PRN
  Administered 2014-07-10: 150 mg via INTRAVENOUS

## 2014-07-10 MED ORDER — FENTANYL CITRATE 0.05 MG/ML IJ SOLN: 25.0000 ug | INTRAMUSCULAR | Status: DC | PRN

## 2014-07-10 MED ORDER — FENTANYL CITRATE 0.05 MG/ML IJ SOLN
INTRAMUSCULAR | Status: DC | PRN
  Administered 2014-07-10 (×4): 50 ug via INTRAVENOUS

## 2014-07-10 MED ORDER — ONDANSETRON HCL 4 MG/2ML IJ SOLN
4.0000 mg | Freq: Once | INTRAMUSCULAR | Status: AC
  Administered 2014-07-10: 4 mg via INTRAVENOUS
  Filled 2014-07-10: qty 2

## 2014-07-10 MED ORDER — PROPOFOL 10 MG/ML IV BOLUS
INTRAVENOUS | Status: AC
  Filled 2014-07-10: qty 20

## 2014-07-10 MED ORDER — KETOROLAC TROMETHAMINE 10 MG PO TABS: 10.0000 mg | ORAL_TABLET | Freq: Three times a day (TID) | ORAL | Status: DC | PRN

## 2014-07-10 MED ORDER — OXYCODONE-ACETAMINOPHEN 5-325 MG PO TABS: 1.0000 | ORAL_TABLET | Freq: Four times a day (QID) | ORAL | Status: DC | PRN

## 2014-07-10 MED ORDER — PROPOFOL 10 MG/ML IV BOLUS
INTRAVENOUS | Status: DC | PRN
  Administered 2014-07-10: 150 mg via INTRAVENOUS

## 2014-07-10 MED ORDER — NEOSTIGMINE METHYLSULFATE 10 MG/10ML IV SOLN
INTRAVENOUS | Status: AC
  Filled 2014-07-10: qty 1

## 2014-07-10 SURGICAL SUPPLY — 25 items
ABLATOR ENDOMETRIAL BIPOLAR (ABLATOR) ×2 IMPLANT
BAG HAMPER (MISCELLANEOUS) ×3 IMPLANT
BLADE 15 SAFETY STRL DISP (BLADE) ×1 IMPLANT
CLOTH BEACON ORANGE TIMEOUT ST (SAFETY) ×3 IMPLANT
COVER LIGHT HANDLE STERIS (MISCELLANEOUS) ×6 IMPLANT
FORMALIN 10 PREFIL 480ML (MISCELLANEOUS) ×3 IMPLANT
GLOVE BIOGEL PI IND STRL 7.0 (GLOVE) ×1 IMPLANT
GLOVE BIOGEL PI IND STRL 8 (GLOVE) ×1 IMPLANT
GLOVE BIOGEL PI INDICATOR 7.0 (GLOVE) ×2
GLOVE BIOGEL PI INDICATOR 8 (GLOVE) ×2
GLOVE ECLIPSE 6.5 STRL STRAW (GLOVE) ×2 IMPLANT
GLOVE ECLIPSE 8.0 STRL XLNG CF (GLOVE) ×3 IMPLANT
GLOVE EXAM NITRILE MD LF STRL (GLOVE) ×2 IMPLANT
GOWN STRL REUS W/TWL LRG LVL3 (GOWN DISPOSABLE) ×4 IMPLANT
GOWN STRL REUS W/TWL XL LVL3 (GOWN DISPOSABLE) ×3 IMPLANT
KIT ROOM TURNOVER AP CYSTO (KITS) ×3 IMPLANT
MANIFOLD NEPTUNE II (INSTRUMENTS) ×3 IMPLANT
NS IRRIG 1000ML POUR BTL (IV SOLUTION) ×3 IMPLANT
PACK PERI GYN (CUSTOM PROCEDURE TRAY) ×3 IMPLANT
PAD ARMBOARD 7.5X6 YLW CONV (MISCELLANEOUS) ×3 IMPLANT
PAD TELFA 3X4 1S STER (GAUZE/BANDAGES/DRESSINGS) ×3 IMPLANT
SET BASIN LINEN APH (SET/KITS/TRAYS/PACK) ×3 IMPLANT
SET IRRIG Y TYPE TUR BLADDER L (SET/KITS/TRAYS/PACK) ×2 IMPLANT
SUT MNCRL 0 VIOLET CTX 36 (SUTURE) ×1 IMPLANT
SUT MONOCRYL 0 CTX 36 (SUTURE)

## 2014-07-10 NOTE — Discharge Instructions (Signed)
Endometrial Ablation Endometrial ablation removes the lining of the uterus (endometrium). It is usually a same-day, outpatient treatment. Ablation helps avoid major surgery, such as surgery to remove the cervix and uterus (hysterectomy). After endometrial ablation, you will have little or no menstrual bleeding and may not be able to have children. However, if you are premenopausal, you will need to use a reliable method of birth control following the procedure because of the small chance that pregnancy can occur. There are different reasons to have this procedure, which include:  Heavy periods.  Bleeding that is causing anemia.  Irregular bleeding.  Bleeding fibroids on the lining inside the uterus if they are smaller than 3 centimeters. This procedure should not be done if:  You want children in the future.  You have severe cramps with your menstrual period.  You have precancerous or cancerous cells in your uterus.  You were recently pregnant.  You have gone through menopause.  You have had major surgery on the uterus, such as a cesarean delivery. LET YOUR HEALTH CARE PROVIDER KNOW ABOUT:  Any allergies you have.  All medicines you are taking, including vitamins, herbs, eye drops, creams, and over-the-counter medicines.  Previous problems you or members of your family have had with the use of anesthetics.  Any blood disorders you have.  Previous surgeries you have had.  Medical conditions you have. RISKS AND COMPLICATIONS  Generally, this is a safe procedure. However, as with any procedure, complications can occur. Possible complications include:  Perforation of the uterus.  Bleeding.  Infection of the uterus, bladder, or vagina.  Injury to surrounding organs.  An air bubble to the lung (air embolus).  Pregnancy following the procedure.  Failure of the procedure to help the problem, requiring hysterectomy.  Decreased ability to diagnose cancer in the lining of  the uterus. BEFORE THE PROCEDURE  The lining of the uterus must be tested to make sure there is no pre-cancerous or cancer cells present.  An ultrasound may be performed to look at the size of the uterus and to check for abnormalities.  Medicines may be given to thin the lining of the uterus. PROCEDURE  During the procedure, your health care provider will use a tool called a resectoscope to help see inside your uterus. There are different ways to remove the lining of your uterus.   Radiofrequency - This method uses a radiofrequency-alternating electric current to remove the lining of the uterus.  Cryotherapy - This method uses extreme cold to freeze the lining of the uterus.  Heated-Free Liquid - This method uses heated salt (saline) solution to remove the lining of the uterus.  Microwave - This method uses high-energy microwaves to heat up the lining of the uterus to remove it.  Thermal balloon - This method involves inserting a catheter with a balloon tip into the uterus. The balloon tip is filled with heated fluid to remove the lining of the uterus. AFTER THE PROCEDURE  After your procedure, do not have sexual intercourse or insert anything into your vagina until permitted by your health care provider. After the procedure, you may experience:  Cramps.  Vaginal discharge.  Frequent urination. Document Released: 04/30/2004 Document Revised: 02/21/2013 Document Reviewed: 11/22/2012 ExitCare Patient Information 2015 [employer], [employer]. This information is not intended to replace advice given to you by your health care provider. Make sure you discuss any questions you have with your health care provider.  

## 2014-07-10 NOTE — Anesthesia Preprocedure Evaluation (Signed)
Anesthesia Evaluation  Patient identified by MRN, date of birth, ID band Patient awake    Reviewed: Allergy & Precautions, NPO status , Patient's Chart, lab work & pertinent test results, reviewed documented beta blocker date and time   History of Anesthesia Complications (+) PONV and history of anesthetic complications  Airway Mallampati: II  TM Distance: >3 FB     Dental  (+) Teeth Intact   Pulmonary neg pulmonary ROS,  breath sounds clear to auscultation        Cardiovascular hypertension, Pt. on medications and Pt. on home beta blockers Rhythm:Regular Rate:Normal     Neuro/Psych    GI/Hepatic GERD-  Medicated,  Endo/Other    Renal/GU      Musculoskeletal   Abdominal   Peds  Hematology  (+) anemia ,   Anesthesia Other Findings   Reproductive/Obstetrics                             Anesthesia Physical Anesthesia Plan  ASA: II  Anesthesia Plan: General   Post-op Pain Management:    Induction: Intravenous, Rapid sequence and Cricoid pressure planned  Airway Management Planned: Oral ETT  Additional Equipment:   Intra-op Plan:   Post-operative Plan: Extubation in OR  Informed Consent: I have reviewed the patients History and Physical, chart, labs and discussed the procedure including the risks, benefits and alternatives for the proposed anesthesia with the patient or authorized representative who has indicated his/her understanding and acceptance.     Plan Discussed with:   Anesthesia Plan Comments:         Anesthesia Quick Evaluation

## 2014-07-10 NOTE — Op Note (Signed)
Preoperative diagnosis: Menometrorrhagia                                        Dysmenorrhea                                        Endometrial polyp   Postoperative diagnoses: Same as above   Procedure: Hysteroscopy, uterine curettage, endometrial ablation using Novasure  Surgeon: Duane LopeLuther Aune Adami MD  Anesthesia: Laryngeal mask airway  Findings: The endometrium was significant for small endometrial polyp. There were no fibroid or other abnormalities.  Description of operation: The patient was taken to the operating room and placed in the supine position. She underwent general anesthesia using the laryngeal mask airway. She was placed in the dorsal lithotomy position and prepped and draped in the usual sterile fashion. A Graves speculum was placed and the anterior cervical lip was grasped with a single-tooth tenaculum. The cervix was dilated serially to allow passage of the hysteroscope. Diagnostic hysteroscopy was performed and was found to be normal. A vigorous uterine curettage was then performed and all tissue sent to pathology for evaluation.  I then proceeded to perform the Novasure endometrial ablation.  The cervical length was 5. The uterus sounded to  11.5 cm yielding a net length of 6.5 cm.  The endometrial cavity was 2.5 cm wide. The power was 89 watts.  The total time of therapy was 1 min 31 seconds.    All of the equipment worked well throughout the procedure.  The patient was awakened from anesthesia and taken to the recovery room in good stable condition all counts were correct. She received 2 g of Ancef and 30 mg of Toradol preoperatively. She will be discharged from the recovery room and followed up in the office in 1- 2 weeks.  Cliff Damiani H 07/10/2014 10:19 AM

## 2014-07-10 NOTE — Anesthesia Procedure Notes (Signed)
Procedure Name: Intubation Date/Time: 07/10/2014 9:26 AM Performed by: Despina HiddenIDACAVAGE, Harrington Jobe J Pre-anesthesia Checklist: Emergency Drugs available, Suction available, Patient being monitored and Patient identified Patient Re-evaluated:Patient Re-evaluated prior to inductionOxygen Delivery Method: Circle system utilized Preoxygenation: Pre-oxygenation with 100% oxygen Intubation Type: IV induction, Rapid sequence and Cricoid Pressure applied Ventilation: Mask ventilation without difficulty Laryngoscope Size: Mac and 3 Grade View: Grade II Tube type: Oral Tube size: 7.0 mm Number of attempts: 1 Airway Equipment and Method: Stylet Placement Confirmation: ETT inserted through vocal cords under direct vision,  positive ETCO2 and breath sounds checked- equal and bilateral Secured at: 20 cm Tube secured with: Tape Dental Injury: Teeth and Oropharynx as per pre-operative assessment

## 2014-07-10 NOTE — H&P (Signed)
Preoperative History and Physical  Cassandra Bennett is a 49 y.o. G3P0010 with No LMP recorded. on megestrol admitted for a hysteroscopy uterine curettage removal of endometrial mss, polyp vs myoma and endometrial ablation.    PMH:    Past Medical History  Diagnosis Date  . Hypertension   . Anemia   . Acid reflux   . PONV (postoperative nausea and vomiting)     PSH:     Past Surgical History  Procedure Laterality Date  . Cesarean section  x2  . Tubal ligation      POb/GynH:      OB History    Gravida Para Term Preterm AB TAB SAB Ectopic Multiple Living   SH:   History  Substance Use Topics  . Smoking status: Never Smoker   . Smokeless tobacco: Never Used  . Alcohol Use: Yes     Comment: RARE SOCIAL    FH:    Family History  Problem Relation Age of Onset  . Heart disease Mother     MI  . Heart disease Father     MI  . Cancer Sister 46    BREAST     Allergies: No Known Allergies  Medications:      Current facility-administered medications: ceFAZolin (ANCEF) IVPB 2 g/50 mL premix, 2 g, Intravenous, On Call to OR, Lazaro Arms, MD;  dexamethasone (DECADRON) injection 4 mg, 4 mg, Intravenous, Once, Laurene Footman, MD;  ketorolac (TORADOL) 30 MG/ML injection 30 mg, 30 mg, Intravenous, Once, Lazaro Arms, MD;  lactated ringers infusion, , Intravenous, Continuous, Laurene Footman, MD midazolam (VERSED) injection 1-2 mg, 1-2 mg, Intravenous, Q5 Min x 3 PRN, Laurene Footman, MD;  ondansetron [facility]) injection 4 mg, 4 mg, Intravenous, Once, Laurene Footman, MD;  scopolamine (TRANSDERM-SCOP) 1 MG/3DAYS 1.5 mg, 1 patch, Transdermal, Once, Laurene Footman, MD  Review of Systems:   Review of Systems  Constitutional: Negative for fever, chills, weight loss, malaise/fatigue and diaphoresis.  HENT: Negative for hearing loss, ear pain, nosebleeds, congestion, sore throat, neck pain, tinnitus and ear discharge.   Eyes: Negative for blurred vision, double vision,  photophobia, pain, discharge and redness.  Respiratory: Negative for cough, hemoptysis, sputum production, shortness of breath, wheezing and stridor.   Cardiovascular: Negative for chest pain, palpitations, orthopnea, claudication, leg swelling and PND.  Gastrointestinal: Positive for abdominal pain. Negative for heartburn, nausea, vomiting, diarrhea, constipation, blood in stool and melena.  Genitourinary: Negative for dysuria, urgency, frequency, hematuria and flank pain.  Musculoskeletal: Negative for myalgias, back pain, joint pain and falls.  Skin: Negative for itching and rash.  Neurological: Negative for dizziness, tingling, tremors, sensory change, speech change, focal weakness, seizures, loss of consciousness, weakness and headaches.  Endo/Heme/Allergies: Negative for environmental allergies and polydipsia. Does not bruise/bleed easily.  Psychiatric/Behavioral: Negative for depression, suicidal ideas, hallucinations, memory loss and substance abuse. The patient is not nervous/anxious and does not have insomnia.      PHYSICAL EXAM:  Blood pressure 151/96, resp. rate 38, SpO2 100 %.    Vitals reviewed. Constitutional: She is oriented to person, place, and time. She appears well-developed and well-nourished.  HENT:  Head: Normocephalic and atraumatic.  Right Ear: External ear normal.  Left Ear: External ear normal.  Nose: Nose normal.  Mouth/Throat: Oropharynx is clear and moist.  Eyes: Conjunctivae and EOM are normal. Pupils are equal, round, and reactive to light. Right eye exhibits no  discharge. Left eye exhibits no discharge. No scleral icterus.  Neck: Normal range of motion. Neck supple. No tracheal deviation present. No thyromegaly present.  Cardiovascular: Normal rate, regular rhythm, normal heart sounds and intact distal pulses.  Exam reveals no gallop and no friction rub.   No murmur heard. Respiratory: Effort normal and breath sounds normal. No respiratory distress. She  has no wheezes. She has no rales. She exhibits no tenderness.  GI: Soft. Bowel sounds are normal. She exhibits no distension and no mass. There is tenderness. There is no rebound and no guarding.  Genitourinary:       Vulva is normal without lesions Vagina is pink moist without discharge Cervix normal in appearance and pap is normal Uterus is normal with endometrial mass Adnexa is negative with normal sized ovaries by sonogram  Musculoskeletal: Normal range of motion. She exhibits no edema and no tenderness.  Neurological: She is alert and oriented to person, place, and time. She has normal reflexes. She displays normal reflexes. No cranial nerve deficit. She exhibits normal muscle tone. Coordination normal.  Skin: Skin is warm and dry. No rash noted. No erythema. No pallor.  Psychiatric: She has a normal mood and affect. Her behavior is normal. Judgment and thought content normal.    Labs: Results for orders placed or performed during the hospital encounter of 07/03/14 (from the past 336 hour(s))  CBC   Collection Time: 07/03/14  9:01 AM  Result Value Ref Range   WBC 8.7 4.0 - 10.5 K/uL   RBC 4.36 3.87 - 5.11 MIL/uL   Hemoglobin 13.3 12.0 - 15.0 g/dL   HCT 09.839.6 11.936.0 - 14.746.0 %   MCV 90.8 78.0 - 100.0 fL   MCH 30.5 26.0 - 34.0 pg   MCHC 33.6 30.0 - 36.0 g/dL   RDW 82.911.9 56.211.5 - 13.015.5 %   Platelets 279 150 - 400 K/uL  Comprehensive metabolic panel   Collection Time: 07/03/14  9:01 AM  Result Value Ref Range   Sodium 139 135 - 145 mmol/L   Potassium 4.3 3.5 - 5.1 mmol/L   Chloride 108 96 - 112 mEq/L   CO2 26 19 - 32 mmol/L   Glucose, Bld 111 (H) 70 - 99 mg/dL   BUN 13 6 - 23 mg/dL   Creatinine, Ser 8.650.62 0.50 - 1.10 mg/dL   Calcium 9.0 8.4 - 78.410.5 mg/dL   Total Protein 6.7 6.0 - 8.3 g/dL   Albumin 3.9 3.5 - 5.2 g/dL   AST 14 0 - 37 U/L   ALT 13 0 - 35 U/L   Alkaline Phosphatase 51 39 - 117 U/L   Total Bilirubin 0.6 0.3 - 1.2 mg/dL   GFR calc non Af Amer >90 >90 mL/min   GFR calc  Af Amer >90 >90 mL/min   Anion gap 5 5 - 15  hCG, quantitative, pregnancy   Collection Time: 07/03/14  9:01 AM  Result Value Ref Range   hCG, Beta Chain, Quant, S <1 <5 mIU/mL  Urinalysis, Routine w reflex microscopic   Collection Time: 07/03/14  9:01 AM  Result Value Ref Range   Color, Urine YELLOW YELLOW   APPearance CLEAR CLEAR   Specific Gravity, Urine 1.025 1.005 - 1.030   pH 6.5 5.0 - 8.0   Glucose, UA NEGATIVE NEGATIVE mg/dL   Hgb urine dipstick LARGE (A) NEGATIVE   Bilirubin Urine NEGATIVE NEGATIVE   Ketones, ur NEGATIVE NEGATIVE mg/dL   Protein, ur 30 (A) NEGATIVE mg/dL   Urobilinogen, UA  0.2 0.0 - 1.0 mg/dL   Nitrite NEGATIVE NEGATIVE   Leukocytes, UA SMALL (A) NEGATIVE  Urine microscopic-add on   Collection Time: 07/03/14  9:01 AM  Result Value Ref Range   Squamous Epithelial / LPF FEW (A) RARE   WBC, UA 7-10 <3 WBC/hpf   RBC / HPF 7-10 <3 RBC/hpf    EKG: Orders placed or performed in visit on 07/03/14  . EKG 12-Lead    Imaging Studies: US Transvaginal Non-ob  06/12/14 GYNECOLOGIC SONOGRAM Cassandra Bennett is a 49 y.o. G3P2A1 for a pelvic sonogram for menorrhagia and dysmenorrhea. Pt is s/p C/S x2 and an BTL. Pt is currently taking Megace and hasn't had a period since beginning the medicine. Uterus 9.9 x 7.8 x 6.3 cm, anteverted with multiple small fibroids noted within largest=1.8cm, cavity of fluid noted that seems to connect with previous C/S site (see pics) Endometrium 22 mm, asymetrical with an ?polyp noted within, (hyperechoic mass with +Doppler flow noted within)= 22.32mm Right ovary 3.0 x 2.0 x 1.8 cm, Left ovary 2.3 x 2.2 x 1.7 cm, No free fluid or adnexal masses noted within the pelvis Technician Comments: Anteverted uterus with multiple fibroids noted within,cavity of fluid noted that seems to connect with previous C/S site (see pics) Endometrium asymmetrical with ?polyp noted  within=22.9mm bilateral ovaries appear WNL, no free fluid or adnexal masses noted within the pelvis Chari Manning Jun 12, 2014 2:22 PM Clinical Impression and recommendations: I have reviewed the sonogram results above, combined with the patient's current clinical course, below are my impressions and any appropriate recommendations for management based on the sonographic findings. Endometrial mass, polyp vs myoma Otherwise normal gyn sonogram EURE,LUTHER H 2014-06-12 3:11 PM    Assessment: Endometrial polyp Patient Active Problem List   Diagnosis Date Noted  . Menorrhagia 05/06/2014  . Pancreatitis, acute 09/13/2011  . HTN (hypertension) 09/13/2011  . Alcohol abuse, episodic 09/13/2011  . Hyperlipemia 09/13/2011  . GERD (gastroesophageal reflux disease) 09/13/2011  . Anemia 09/13/2011    Plan: Hysteroscopy uterine curettage endometrial ablation with removal of endometrial polyp  EURE,LUTHER H 07/10/2014 8:39 AM

## 2014-07-10 NOTE — Transfer of Care (Signed)
Immediate Anesthesia Transfer of Care Note  Patient: Cassandra AweKimberly L Gallion  Procedure(s) Performed: Procedure(s) with comments: DILATATION & CURETTAGE/HYSTEROSCOPY WITH ENDOMETRIAL ABLATION (N/A) - 6.5 cm length, 2.5 cm width, 89 power; 1 minute 31 seconds REMOVAL OF ENDOMETRIAL MASS (N/A)  Patient Location: PACU  Anesthesia Type:General  Level of Consciousness: awake, alert , oriented and patient cooperative  Airway & Oxygen Therapy: Patient Spontanous Breathing and Patient connected to face mask oxygen  Post-op Assessment: Report given to PACU RN, Post -op Vital signs reviewed and stable and Patient moving all extremities  Post vital signs: Reviewed and stable  Complications: No apparent anesthesia complications

## 2014-07-10 NOTE — Anesthesia Postprocedure Evaluation (Signed)
  Anesthesia Post-op Note  Patient: Cassandra Bennett  Procedure(s) Performed: Procedure(s) with comments: DILATATION & CURETTAGE/HYSTEROSCOPY WITH ENDOMETRIAL ABLATION (N/A) - 6.5 cm length, 2.5 cm width, 89 power; 1 minute 31 seconds REMOVAL OF ENDOMETRIAL MASS (N/A)  Patient Location: PACU  Anesthesia Type:General  Level of Consciousness: awake, alert , oriented and patient cooperative  Airway and Oxygen Therapy: Patient Spontanous Breathing  Post-op Pain: 3 /10, mild  Post-op Assessment: Post-op Vital signs reviewed, Patient's Cardiovascular Status Stable, Respiratory Function Stable, Patent Airway, No signs of Nausea or vomiting and Pain level controlled  Post-op Vital Signs: Reviewed and stable  Last Vitals:  Filed Vitals:   07/10/14 0910  BP: 132/95  Resp: 33    Complications: No apparent anesthesia complications

## 2014-07-11 ENCOUNTER — Encounter (HOSPITAL_COMMUNITY): Payer: Self-pay | Admitting: Obstetrics & Gynecology

## 2014-07-17 ENCOUNTER — Encounter: Payer: Self-pay | Admitting: Obstetrics & Gynecology

## 2014-07-17 ENCOUNTER — Ambulatory Visit (INDEPENDENT_AMBULATORY_CARE_PROVIDER_SITE_OTHER): Payer: Medicaid Other | Admitting: Obstetrics & Gynecology

## 2014-07-17 VITALS — BP 166/90 | Wt 170.0 lb

## 2014-07-17 DIAGNOSIS — Z9889 Other specified postprocedural states: Secondary | ICD-10-CM

## 2014-07-17 NOTE — Progress Notes (Signed)
Patient ID: Cassandra Bennett, female   DOB: 29-Nov-1965, 49 y.o.   MRN: [id number]  HPI: Patient returns for routine postoperative follow-up having undergone hysteroscopy uterine curettage removal of endoemtrial poly with NovaSure endometrial abaltion on 07/10/2013. The patient's early postoperative recovery while in the hospital was notable for outpatient. Since hospital discharge the patient reports no problems, watery discharge.   Current Outpatient Prescriptions  Medication Sig Dispense Refill  . ferrous sulfate (IRON SUPPLEMENT) 325 (65 FE) MG tablet Take 325 mg by mouth 2 (two) times daily with a meal.    . ketorolac (TORADOL) 10 MG tablet Take 1 tablet (10 mg total) by mouth every 8 (eight) hours as needed. (Patient not taking: Reported on 07/17/2014) 15 tablet 0  . metoprolol succinate (TOPROL-XL) 50 MG 24 hr tablet Take 1 tablet (50 mg total) by mouth daily. Take with or immediately following a meal. (Patient not taking: Reported on 07/17/2014) 30 tablet 5  . Multiple Vitamin (MULTIVITAMIN WITH MINERALS) TABS tablet Take 1 tablet by mouth daily.    . Multiple Vitamins-Minerals (HAIR/SKIN/NAILS) TABS Take 1 tablet by mouth daily.    . Omega-3 Fatty Acids (FISH OIL) 600 MG CAPS Take 1 capsule by mouth 2 (two) times daily.     Marland Kitchen. omeprazole (PRILOSEC) 20 MG capsule Take 1 capsule (20 mg total) by mouth daily before breakfast. (Patient not taking: Reported on 07/17/2014) 30 capsule 5  . ondansetron (ZOFRAN) 8 MG tablet Take 1 tablet (8 mg total) by mouth every 8 (eight) hours as needed for nausea. (Patient not taking: Reported on 07/17/2014) 12 tablet 0  . oxyCODONE-acetaminophen (ROXICET) 5-325 MG per tablet Take 1-2 tablets by mouth every 6 (six) hours as needed for severe pain. (Patient not taking: Reported on 07/17/2014) 30 tablet 0   No current facility-administered medications for this visit.    Physical Exam: Minimal watery discharge Uterus is not tender  Diagnostic  Tests: none  Impression: Normal post op ablation, NovaSure  Plan: rpoutine follow up No sex while having watery discharge

## 2014-09-11 ENCOUNTER — Encounter: Payer: Self-pay | Admitting: Nurse Practitioner

## 2014-09-11 ENCOUNTER — Ambulatory Visit (INDEPENDENT_AMBULATORY_CARE_PROVIDER_SITE_OTHER): Payer: Medicaid Other | Admitting: Nurse Practitioner

## 2014-09-11 VITALS — BP 100/70 | Temp 98.2°F | Ht 64.5 in | Wt 169.0 lb

## 2014-09-11 DIAGNOSIS — N949 Unspecified condition associated with female genital organs and menstrual cycle: Secondary | ICD-10-CM

## 2014-09-11 DIAGNOSIS — B9689 Other specified bacterial agents as the cause of diseases classified elsewhere: Secondary | ICD-10-CM

## 2014-09-11 DIAGNOSIS — Z113 Encounter for screening for infections with a predominantly sexual mode of transmission: Secondary | ICD-10-CM

## 2014-09-11 DIAGNOSIS — A499 Bacterial infection, unspecified: Secondary | ICD-10-CM | POA: Diagnosis not present

## 2014-09-11 DIAGNOSIS — N9489 Other specified conditions associated with female genital organs and menstrual cycle: Secondary | ICD-10-CM

## 2014-09-11 DIAGNOSIS — N760 Acute vaginitis: Secondary | ICD-10-CM | POA: Diagnosis not present

## 2014-09-11 LAB — POCT URINALYSIS DIPSTICK
PH UA: 7
Spec Grav, UA: <=1.005

## 2014-09-11 MED ORDER — METRONIDAZOLE 500 MG PO TABS: 500.0000 mg | ORAL_TABLET | Freq: Two times a day (BID) | ORAL | Status: DC

## 2014-09-12 LAB — GC/CHLAMYDIA PROBE AMP
Chlamydia trachomatis, NAA: NEGATIVE
Neisseria gonorrhoeae by PCR: NEGATIVE

## 2014-09-14 ENCOUNTER — Encounter: Payer: Self-pay | Admitting: Nurse Practitioner

## 2014-09-14 LAB — POCT WET PREP WITH KOH
BACTERIA WET PREP HPF POC: POSITIVE
CLUE CELLS WET PREP PER HPF POC: POSITIVE
EPITHELIAL WET PREP PER HPF POC: POSITIVE
KOH Prep POC: NEGATIVE
PH WET PREP: 5.5
RBC WET PREP PER HPF POC: NEGATIVE
Trichomonas, UA: NEGATIVE
WBC Wet Prep HPF POC: NEGATIVE
Yeast Wet Prep HPF POC: NEGATIVE

## 2014-09-14 NOTE — Progress Notes (Signed)
Subjective:  Presents for c/o vaginal burning and itching x 1 week. Has had one new sexual partner; unprotected sex. No discharge. Had ablation. Bleeding off/on; improving. No urinary symptoms. No pelvic pain.   Objective:   BP 100/70 mmHg  Temp(Src) 98.2 F (36.8 C) (Oral)  Ht 5' 4.5" (1.638 m)  Wt 169 lb (76.658 kg)  BMI 28.57 kg/m2 NAD. Alert, oriented. Lungs clear. Heart RRR. Abdomen soft, non distended, non tender. UA neg. Wet prep: ph 5.5 with clue cells.  Assessment: Vaginal burning - Plan: POCT urinalysis dipstick  Screen for STD (sexually transmitted disease) - Plan: GC/Chlamydia Probe Amp  Bacterial vaginosis - Plan: POCT Wet Prep with KOH   Plan:  Meds ordered this encounter  Medications  . metroNIDAZOLE (FLAGYL) 500 MG tablet    Sig: Take 1 tablet (500 mg total) by mouth 2 (two) times daily with a meal.    Dispense:  14 tablet    Refill:  0    Order Specific Question:  Supervising Provider    Answer:  Merlyn AlbertLUKING, WILLIAM S [[id number]]   Discussed safe sex issues. Call back if worsens or persists.

## 2014-10-04 ENCOUNTER — Other Ambulatory Visit: Payer: Self-pay | Admitting: Nurse Practitioner

## 2015-01-13 ENCOUNTER — Other Ambulatory Visit: Payer: Self-pay | Admitting: Nurse Practitioner

## 2015-02-11 ENCOUNTER — Other Ambulatory Visit: Payer: Self-pay | Admitting: Nurse Practitioner

## 2015-03-31 ENCOUNTER — Other Ambulatory Visit: Payer: Self-pay | Admitting: Family Medicine

## 2015-03-31 ENCOUNTER — Other Ambulatory Visit: Payer: Self-pay | Admitting: Nurse Practitioner

## 2015-04-14 ENCOUNTER — Other Ambulatory Visit: Payer: Self-pay | Admitting: Family Medicine

## 2015-04-14 NOTE — Telephone Encounter (Signed)
May I refill last seen for chronic visit October 2015.

## 2015-04-14 NOTE — Telephone Encounter (Signed)
Give 30 d worth advise pt needs o v

## 2015-05-26 ENCOUNTER — Other Ambulatory Visit: Payer: Self-pay | Admitting: Family Medicine

## 2015-06-23 ENCOUNTER — Ambulatory Visit (INDEPENDENT_AMBULATORY_CARE_PROVIDER_SITE_OTHER): Payer: Medicaid Other | Admitting: Nurse Practitioner

## 2015-06-23 ENCOUNTER — Encounter: Payer: Self-pay | Admitting: Nurse Practitioner

## 2015-06-23 VITALS — BP 140/86 | Ht 64.5 in | Wt 171.0 lb

## 2015-06-23 DIAGNOSIS — E785 Hyperlipidemia, unspecified: Secondary | ICD-10-CM | POA: Diagnosis not present

## 2015-06-23 DIAGNOSIS — R5383 Other fatigue: Secondary | ICD-10-CM

## 2015-06-23 DIAGNOSIS — I10 Essential (primary) hypertension: Secondary | ICD-10-CM

## 2015-06-23 DIAGNOSIS — D649 Anemia, unspecified: Secondary | ICD-10-CM | POA: Diagnosis not present

## 2015-06-23 DIAGNOSIS — Z79899 Other long term (current) drug therapy: Secondary | ICD-10-CM

## 2015-06-23 MED ORDER — METOPROLOL SUCCINATE ER 50 MG PO TB24: ORAL_TABLET | ORAL | Status: DC

## 2015-06-23 MED ORDER — OMEPRAZOLE 20 MG PO CPDR: DELAYED_RELEASE_CAPSULE | ORAL | Status: DC

## 2015-06-24 LAB — CBC WITH DIFFERENTIAL/PLATELET
BASOS ABS: 0.1 10*3/uL (ref 0.0–0.2)
BASOS: 1 %
EOS (ABSOLUTE): 0.3 10*3/uL (ref 0.0–0.4)
Eos: 3 %
Hematocrit: 41.2 % (ref 34.0–46.6)
Hemoglobin: 14.2 g/dL (ref 11.1–15.9)
IMMATURE GRANS (ABS): 0 10*3/uL (ref 0.0–0.1)
Immature Granulocytes: 0 %
LYMPHS ABS: 2.3 10*3/uL (ref 0.7–3.1)
LYMPHS: 24 %
MCH: 31 pg (ref 26.6–33.0)
MCHC: 34.5 g/dL (ref 31.5–35.7)
MCV: 90 fL (ref 79–97)
MONOS ABS: 0.6 10*3/uL (ref 0.1–0.9)
Monocytes: 7 %
NEUTROS ABS: 6.1 10*3/uL (ref 1.4–7.0)
Neutrophils: 65 %
PLATELETS: 299 10*3/uL (ref 150–379)
RBC: 4.58 x10E6/uL (ref 3.77–5.28)
RDW: 12.7 % (ref 12.3–15.4)
WBC: 9.3 10*3/uL (ref 3.4–10.8)

## 2015-06-24 LAB — BASIC METABOLIC PANEL
BUN/Creatinine Ratio: 14 (ref 9–23)
BUN: 10 mg/dL (ref 6–24)
CO2: 21 mmol/L (ref 18–29)
CREATININE: 0.69 mg/dL (ref 0.57–1.00)
Calcium: 9.1 mg/dL (ref 8.7–10.2)
Chloride: 101 mmol/L (ref 96–106)
GFR, EST AFRICAN AMERICAN: 119 mL/min/{1.73_m2} (ref >59)
GFR, EST NON AFRICAN AMERICAN: 103 mL/min/{1.73_m2} (ref >59)
Glucose: 105 mg/dL — ABNORMAL HIGH (ref 65–99)
POTASSIUM: 4.2 mmol/L (ref 3.5–5.2)
SODIUM: 140 mmol/L (ref 134–144)

## 2015-06-24 LAB — HEPATIC FUNCTION PANEL
ALBUMIN: 4.3 g/dL (ref 3.5–5.5)
ALK PHOS: 72 IU/L (ref 39–117)
ALT: 15 IU/L (ref 0–32)
AST: 17 IU/L (ref 0–40)
BILIRUBIN TOTAL: 0.5 mg/dL (ref 0.0–1.2)
BILIRUBIN, DIRECT: 0.1 mg/dL (ref 0.00–0.40)
TOTAL PROTEIN: 7.1 g/dL (ref 6.0–8.5)

## 2015-06-24 LAB — LIPID PANEL
CHOLESTEROL TOTAL: 229 mg/dL — AB (ref 100–199)
Chol/HDL Ratio: 5.7 ratio units — ABNORMAL HIGH (ref 0.0–4.4)
HDL: 40 mg/dL (ref >39)
LDL Calculated: 155 mg/dL — ABNORMAL HIGH (ref 0–99)
Triglycerides: 168 mg/dL — ABNORMAL HIGH (ref 0–149)
VLDL Cholesterol Cal: 34 mg/dL (ref 5–40)

## 2015-06-24 LAB — TSH: TSH: 1.26 u[IU]/mL (ref 0.450–4.500)

## 2015-06-25 ENCOUNTER — Encounter: Payer: Self-pay | Admitting: Nurse Practitioner

## 2015-06-25 NOTE — Addendum Note (Signed)
Addended by: Campbell RichesHOSKINS, CAROLYN C on: 06/25/2015 07:48 AM   Modules accepted: Level of Service

## 2015-06-25 NOTE — Progress Notes (Signed)
Subjective:  Presents for routine follow-up. Has not taken her blood pressure pill today.No chest pain/schemic type pain or shortness of breath.No change in diet or activity. Minimal fatigue. Takes daily multivitamin. Has had endometrial ablation. Continues to have cycles although much lighter. History of anemia.reflux stable on omeprazole.  Objective:   BP 140/86 mmHg  Ht 5' 4.5" (1.638 m)  Wt 171 lb (77.565 kg)  BMI 28.91 kg/m2 NAD. Alert, oriented. Lungs clear. Heart regular rate rhythm. Lower extremities no edema. Weight stable. abdomen soft nondistended nontender.  Assessment:  Problem List Items Addressed This Visit      Cardiovascular and Mediastinum   HTN (hypertension) - Primary   Relevant Medications   metoprolol succinate (TOPROL-XL) 50 MG 24 hr tablet   Other Relevant Orders   Basic metabolic panel (Completed)     Other   Anemia   Relevant Orders   CBC with Differential/Platelet (Completed)   Hyperlipemia   Relevant Medications   metoprolol succinate (TOPROL-XL) 50 MG 24 hr tablet   Other Relevant Orders   Lipid panel (Completed)    Other Visit Diagnoses    Other fatigue        Relevant Orders    TSH (Completed)    High risk medication use        Relevant Orders    Hepatic function panel (Completed)      Plan:  Meds ordered this encounter  Medications  . metoprolol succinate (TOPROL-XL) 50 MG 24 hr tablet    Sig: TAKE ONE TABLET BY MOUTH ONCE DAILY WITH  A  MEAL OR IMMEDIATELY FOLLOWING A MEAL    Dispense:  30 tablet    Refill:  5    Order Specific Question:  Supervising Provider    Answer:  Merlyn AlbertLUKING, WILLIAM S [[id number]]  . omeprazole (PRILOSEC) 20 MG capsule    Sig: TAKE ONE CAPSULE BY MOUTH ONCE DAILY BEFORE BREAKFAST    Dispense:  30 capsule    Refill:  5    Order Specific Question:  Supervising Provider    Answer:  Merlyn AlbertLUKING, WILLIAM S [[id number]]   continueweight loss efforts. Increase activity. Return in about 6 months (around 12/22/2015) for physical.

## 2016-01-12 ENCOUNTER — Encounter: Payer: Medicaid Other | Admitting: Nurse Practitioner

## 2016-03-23 ENCOUNTER — Other Ambulatory Visit: Payer: Self-pay | Admitting: Nurse Practitioner

## 2016-04-19 ENCOUNTER — Other Ambulatory Visit: Payer: Self-pay | Admitting: Family Medicine

## 2016-04-19 NOTE — Telephone Encounter (Signed)
21 tabs, visist necessary

## 2016-05-07 ENCOUNTER — Other Ambulatory Visit: Payer: Self-pay | Admitting: Nurse Practitioner

## 2016-05-14 ENCOUNTER — Other Ambulatory Visit: Payer: Self-pay | Admitting: Family Medicine

## 2016-05-29 ENCOUNTER — Other Ambulatory Visit: Payer: Self-pay | Admitting: Family Medicine

## 2016-06-19 ENCOUNTER — Other Ambulatory Visit: Payer: Self-pay | Admitting: Family Medicine

## 2016-06-21 NOTE — Telephone Encounter (Signed)
30 d only ea, needs appt

## 2016-06-21 NOTE — Telephone Encounter (Signed)
Last seen 06/23/15. Ok to refill?

## 2016-06-30 ENCOUNTER — Ambulatory Visit: Payer: Medicaid Other | Admitting: Nurse Practitioner

## 2016-07-09 ENCOUNTER — Ambulatory Visit (INDEPENDENT_AMBULATORY_CARE_PROVIDER_SITE_OTHER): Payer: Self-pay | Admitting: Family Medicine

## 2016-07-09 ENCOUNTER — Encounter: Payer: Self-pay | Admitting: Family Medicine

## 2016-07-09 VITALS — BP 118/84 | Ht 65.0 in | Wt 175.0 lb

## 2016-07-09 DIAGNOSIS — I10 Essential (primary) hypertension: Secondary | ICD-10-CM

## 2016-07-09 MED ORDER — OMEPRAZOLE 20 MG PO CPDR: DELAYED_RELEASE_CAPSULE | ORAL | 5 refills | Status: AC

## 2016-07-09 MED ORDER — METOPROLOL SUCCINATE ER 50 MG PO TB24: ORAL_TABLET | ORAL | 5 refills | Status: AC

## 2016-07-09 NOTE — Progress Notes (Signed)
   Subjective:    Patient ID: Cassandra Bennett, female    DOB: February 10, 1966, 51 y.o.   MRN: [id number]  Hypertension  This is a chronic problem. Treatments tried: metoprolol. Compliance problems include exercise.    Pt states no concerns. Needs metoprolol and omeprazole refilled.   BP numbers at hoe, on the high side, stays at the bordr of numbers Non smoker Blood pressure medicine and blood pressure levels reviewed today with patient. Compliant with blood pressure medicine. States does not miss a dose. No obvious side effects. Blood pressure generally good when checked elsewhere. Watching salt intake.   Non exercisier  doi ghome cqre  Review of Systems No headache, no major weight loss or weight gain, no chest pain no back pain abdominal pain no change in bowel habits complete ROS otherwise negative     Objective:   Physical Exam Alert vitals stable, NAD. Blood pressure good on repeat. HEENT normal. Lungs clear. Heart regular rate and rhythm.        Assessment & Plan:  Impression hypertension good control discussed to maintain same plan diet exercise discussed. Medications refilled. Recheck in 6 months

## 2016-07-26 ENCOUNTER — Ambulatory Visit (INDEPENDENT_AMBULATORY_CARE_PROVIDER_SITE_OTHER): Payer: Medicaid Other | Admitting: Nurse Practitioner

## 2016-07-26 ENCOUNTER — Other Ambulatory Visit: Payer: Self-pay | Admitting: Nurse Practitioner

## 2016-07-26 ENCOUNTER — Encounter: Payer: Self-pay | Admitting: Nurse Practitioner

## 2016-07-26 VITALS — BP 138/96 | Ht 64.0 in | Wt 176.2 lb

## 2016-07-26 DIAGNOSIS — D509 Iron deficiency anemia, unspecified: Secondary | ICD-10-CM | POA: Diagnosis not present

## 2016-07-26 DIAGNOSIS — Z01419 Encounter for gynecological examination (general) (routine) without abnormal findings: Secondary | ICD-10-CM | POA: Diagnosis not present

## 2016-07-26 DIAGNOSIS — Z1151 Encounter for screening for human papillomavirus (HPV): Secondary | ICD-10-CM

## 2016-07-26 DIAGNOSIS — E785 Hyperlipidemia, unspecified: Secondary | ICD-10-CM

## 2016-07-26 DIAGNOSIS — I10 Essential (primary) hypertension: Secondary | ICD-10-CM | POA: Diagnosis not present

## 2016-07-26 DIAGNOSIS — Z124 Encounter for screening for malignant neoplasm of cervix: Secondary | ICD-10-CM

## 2016-07-26 DIAGNOSIS — Z1231 Encounter for screening mammogram for malignant neoplasm of breast: Secondary | ICD-10-CM

## 2016-07-27 ENCOUNTER — Encounter: Payer: Self-pay | Admitting: Nurse Practitioner

## 2016-07-27 LAB — CBC WITH DIFFERENTIAL/PLATELET
BASOS: 0 %
Basophils Absolute: 0 10*3/uL (ref 0.0–0.2)
EOS (ABSOLUTE): 0.1 10*3/uL (ref 0.0–0.4)
EOS: 2 %
HEMATOCRIT: 41.1 % (ref 34.0–46.6)
HEMOGLOBIN: 14.2 g/dL (ref 11.1–15.9)
Immature Grans (Abs): 0 10*3/uL (ref 0.0–0.1)
Immature Granulocytes: 0 %
LYMPHS ABS: 2.3 10*3/uL (ref 0.7–3.1)
Lymphs: 30 %
MCH: 30.6 pg (ref 26.6–33.0)
MCHC: 34.5 g/dL (ref 31.5–35.7)
MCV: 89 fL (ref 79–97)
MONOCYTES: 8 %
MONOS ABS: 0.6 10*3/uL (ref 0.1–0.9)
NEUTROS ABS: 4.8 10*3/uL (ref 1.4–7.0)
Neutrophils: 60 %
Platelets: 267 10*3/uL (ref 150–379)
RBC: 4.64 x10E6/uL (ref 3.77–5.28)
RDW: 13.2 % (ref 12.3–15.4)
WBC: 7.9 10*3/uL (ref 3.4–10.8)

## 2016-07-27 LAB — BASIC METABOLIC PANEL
BUN/Creatinine Ratio: 19 (ref 9–23)
BUN: 10 mg/dL (ref 6–24)
CO2: 24 mmol/L (ref 18–29)
CREATININE: 0.53 mg/dL — AB (ref 0.57–1.00)
Calcium: 9.5 mg/dL (ref 8.7–10.2)
Chloride: 101 mmol/L (ref 96–106)
GFR, EST AFRICAN AMERICAN: 129 mL/min/{1.73_m2} (ref >59)
GFR, EST NON AFRICAN AMERICAN: 112 mL/min/{1.73_m2} (ref >59)
Glucose: 99 mg/dL (ref 65–99)
POTASSIUM: 4.6 mmol/L (ref 3.5–5.2)
SODIUM: 140 mmol/L (ref 134–144)

## 2016-07-27 LAB — HEPATIC FUNCTION PANEL
ALK PHOS: 61 IU/L (ref 39–117)
ALT: 18 IU/L (ref 0–32)
AST: 22 IU/L (ref 0–40)
Albumin: 4.5 g/dL (ref 3.5–5.5)
Bilirubin Total: 0.5 mg/dL (ref 0.0–1.2)
Bilirubin, Direct: 0.1 mg/dL (ref 0.00–0.40)
Total Protein: 7.3 g/dL (ref 6.0–8.5)

## 2016-07-27 LAB — LIPID PANEL
CHOL/HDL RATIO: 5.7 ratio — AB (ref 0.0–4.4)
CHOLESTEROL TOTAL: 251 mg/dL — AB (ref 100–199)
HDL: 44 mg/dL (ref >39)
LDL Calculated: 171 mg/dL — ABNORMAL HIGH (ref 0–99)
TRIGLYCERIDES: 181 mg/dL — AB (ref 0–149)
VLDL Cholesterol Cal: 36 mg/dL (ref 5–40)

## 2016-07-27 LAB — TSH: TSH: 1.55 u[IU]/mL (ref 0.450–4.500)

## 2016-07-27 LAB — VITAMIN D 25 HYDROXY (VIT D DEFICIENCY, FRACTURES): Vit D, 25-Hydroxy: 23.5 ng/mL — ABNORMAL LOW (ref 30.0–100.0)

## 2016-07-27 NOTE — Progress Notes (Signed)
   Subjective:    Patient ID: Cassandra Bennett, female    DOB: 18-May-1966, 51 y.o.   MRN: [id number]  HPI presents for her wellness exam. Same sexual partner. Regular cycles, light flow. Some cramps. Has had ablation and BTL. Has active job. Took BP med about 45 minutes ago. Needs eye exam. Regular dental care.     Review of Systems  Constitutional: Negative for activity change, appetite change and fatigue.  HENT: Negative for dental problem, ear pain, sinus pressure and sore throat.   Respiratory: Negative for cough, chest tightness, shortness of breath and wheezing.   Cardiovascular: Negative for chest pain.  Gastrointestinal: Negative for abdominal distention, abdominal pain, blood in stool, constipation, diarrhea, nausea and vomiting.  Genitourinary: Negative for difficulty urinating, dysuria, enuresis, frequency, genital sores, menstrual problem, pelvic pain, urgency and vaginal discharge.       Objective:   Physical Exam  Constitutional: She is oriented to person, place, and time. She appears well-developed. No distress.  HENT:  Right Ear: External ear normal.  Left Ear: External ear normal.  Mouth/Throat: Oropharynx is clear and moist.  Neck: Normal range of motion. Neck supple. No tracheal deviation present. No thyromegaly present.  Cardiovascular: Normal rate, regular rhythm and normal heart sounds.  Exam reveals no gallop.   No murmur heard. Pulmonary/Chest: Effort normal and breath sounds normal.  Abdominal: Soft. She exhibits no distension. There is no tenderness.  Genitourinary: Vagina normal and uterus normal. No vaginal discharge found.  Genitourinary Comments: External GU: no rashes or lesions. Vagina: no discharge. Cervix normal in appearance. Bimanual exam: no tenderness or obvious masses.   Musculoskeletal: She exhibits no edema.  Lymphadenopathy:    She has no cervical adenopathy.  Neurological: She is alert and oriented to person, place, and time.  Skin: Skin  is warm and dry. No rash noted.  Psychiatric: She has a normal mood and affect. Her behavior is normal.  Vitals reviewed. Breast exam: dense tissue; no obvious masses; axillae no adenopathy.         Assessment & Plan:   Problem List Items Addressed This Visit      Cardiovascular and Mediastinum   HTN (hypertension)     Other   Anemia   Relevant Orders   CBC with Differential (Completed)   Hyperlipemia   Relevant Orders   Basic Metabolic Panel (BMET) (Completed)   Lipid Profile (Completed)   Hepatic function panel (Completed)    Other Visit Diagnoses    Well woman exam    -  Primary   Relevant Orders   Pap IG and HPV (high risk) DNA detection   TSH (Completed)   Vitamin D (25 hydroxy) (Completed)   Screening for cervical cancer       Relevant Orders   Pap IG and HPV (high risk) DNA detection   Screening for HPV (human papillomavirus)       Relevant Orders   Pap IG and HPV (high risk) DNA detection     Encouraged weight loss and regular activity. Daily vitamin D and calcium.  Return in about 6 months (around 01/23/2017) for BP check up.

## 2016-07-28 ENCOUNTER — Other Ambulatory Visit: Payer: Self-pay | Admitting: Nurse Practitioner

## 2016-07-28 ENCOUNTER — Telehealth: Payer: Self-pay | Admitting: Pediatrics

## 2016-07-28 DIAGNOSIS — E785 Hyperlipidemia, unspecified: Secondary | ICD-10-CM

## 2016-07-28 DIAGNOSIS — I10 Essential (primary) hypertension: Secondary | ICD-10-CM

## 2016-07-28 MED ORDER — VITAMIN D (ERGOCALCIFEROL) 1.25 MG (50000 UNIT) PO CAPS: 50000.0000 [IU] | ORAL_CAPSULE | ORAL | 2 refills | Status: AC

## 2016-07-28 MED ORDER — ROSUVASTATIN CALCIUM 10 MG PO TABS: 10.0000 mg | ORAL_TABLET | Freq: Every day | ORAL | 2 refills | Status: DC

## 2016-07-28 NOTE — Telephone Encounter (Signed)
-----   Message from Campbell Richesarolyn C Hoskins, NP sent at 07/28/2016 12:29 PM EST ----- Meds have been ordered. Will need repeat lipid and liver profiles in about 6-8 weeks to see if med is working. Thanks.

## 2016-07-29 ENCOUNTER — Telehealth: Payer: Self-pay | Admitting: Family Medicine

## 2016-07-29 LAB — PAP IG AND HPV HIGH-RISK
HPV, high-risk: NEGATIVE
PAP SMEAR COMMENT: 0

## 2016-07-29 NOTE — Telephone Encounter (Signed)
Patient was prescribed Crestor yesterday by Eber Jonesarolyn.  She said this medication is way too expensive for her and would like a cheaper alternative called in.  She  talked to pharmacist and lovasatin would be cheaper.  [facility] [place]

## 2016-07-30 ENCOUNTER — Other Ambulatory Visit: Payer: Self-pay | Admitting: Nurse Practitioner

## 2016-07-30 MED ORDER — LOVASTATIN 20 MG PO TABS: ORAL_TABLET | ORAL | 0 refills | Status: AC

## 2016-07-30 NOTE — Telephone Encounter (Signed)
Left message to return call 

## 2016-07-30 NOTE — Telephone Encounter (Signed)
Discussed with pt. Pt verbalized understanding.  °

## 2016-07-30 NOTE — Telephone Encounter (Signed)
Sorry. Just realized she had family planning Medicaid which does not cover med. Will send in med as requested. Repeat labs as planned.

## 2016-08-04 ENCOUNTER — Ambulatory Visit (HOSPITAL_COMMUNITY)
Admission: RE | Admit: 2016-08-04 | Discharge: 2016-08-04 | Disposition: A | Discharge status: Home / Self Care | Payer: Medicaid Other | Source: Ambulatory Visit | Attending: Nurse Practitioner | Admitting: Nurse Practitioner

## 2016-08-04 DIAGNOSIS — Z1231 Encounter for screening mammogram for malignant neoplasm of breast: Secondary | ICD-10-CM | POA: Insufficient documentation

## 2016-08-05 ENCOUNTER — Other Ambulatory Visit: Payer: Self-pay | Admitting: Nurse Practitioner

## 2016-08-05 DIAGNOSIS — R928 Other abnormal and inconclusive findings on diagnostic imaging of breast: Secondary | ICD-10-CM

## 2016-08-24 ENCOUNTER — Encounter (HOSPITAL_COMMUNITY): Payer: Self-pay

## 2016-08-24 ENCOUNTER — Encounter (HOSPITAL_COMMUNITY): Payer: Medicaid Other

## 2016-09-07 ENCOUNTER — Other Ambulatory Visit (HOSPITAL_COMMUNITY): Payer: Self-pay | Admitting: *Deleted

## 2016-09-07 DIAGNOSIS — R229 Localized swelling, mass and lump, unspecified: Principal | ICD-10-CM

## 2016-09-07 DIAGNOSIS — IMO0002 Reserved for concepts with insufficient information to code with codable children: Secondary | ICD-10-CM

## 2016-09-20 ENCOUNTER — Other Ambulatory Visit (HOSPITAL_COMMUNITY): Payer: Self-pay | Admitting: *Deleted

## 2016-09-20 DIAGNOSIS — N6310 Unspecified lump in the right breast, unspecified quadrant: Secondary | ICD-10-CM

## 2016-09-21 ENCOUNTER — Ambulatory Visit (HOSPITAL_COMMUNITY)
Admission: RE | Admit: 2016-09-21 | Discharge: 2016-09-21 | Disposition: A | Discharge status: Home / Self Care | Payer: PRIVATE HEALTH INSURANCE | Source: Ambulatory Visit | Attending: *Deleted | Admitting: *Deleted

## 2016-09-21 ENCOUNTER — Encounter (HOSPITAL_COMMUNITY): Payer: Medicaid Other

## 2016-09-21 ENCOUNTER — Encounter (HOSPITAL_COMMUNITY): Payer: Self-pay

## 2016-09-21 DIAGNOSIS — N6310 Unspecified lump in the right breast, unspecified quadrant: Secondary | ICD-10-CM | POA: Insufficient documentation

## 2016-11-12 ENCOUNTER — Encounter: Payer: Self-pay | Admitting: Family Medicine

## 2017-01-24 ENCOUNTER — Ambulatory Visit: Payer: Self-pay | Admitting: Nurse Practitioner

## 2018-05-04 ENCOUNTER — Other Ambulatory Visit (HOSPITAL_COMMUNITY): Payer: Self-pay | Admitting: Nurse Practitioner

## 2018-05-04 DIAGNOSIS — Z1231 Encounter for screening mammogram for malignant neoplasm of breast: Secondary | ICD-10-CM

## 2018-08-07 ENCOUNTER — Ambulatory Visit (HOSPITAL_COMMUNITY)
Admission: RE | Admit: 2018-08-07 | Discharge: 2018-08-07 | Disposition: A | Discharge status: Home / Self Care | Payer: BLUE CROSS/BLUE SHIELD | Source: Ambulatory Visit | Attending: Nurse Practitioner | Admitting: Nurse Practitioner

## 2018-08-07 DIAGNOSIS — Z1231 Encounter for screening mammogram for malignant neoplasm of breast: Secondary | ICD-10-CM | POA: Diagnosis present

## 2019-07-11 ENCOUNTER — Other Ambulatory Visit (HOSPITAL_COMMUNITY): Payer: Self-pay | Admitting: Physician Assistant

## 2019-07-11 DIAGNOSIS — Z1231 Encounter for screening mammogram for malignant neoplasm of breast: Secondary | ICD-10-CM

## 2019-07-23 ENCOUNTER — Other Ambulatory Visit: Payer: Self-pay

## 2019-07-23 ENCOUNTER — Ambulatory Visit (HOSPITAL_COMMUNITY)
Admission: RE | Admit: 2019-07-23 | Discharge: 2019-07-23 | Disposition: A | Discharge status: Home / Self Care | Payer: BLUE CROSS/BLUE SHIELD | Source: Ambulatory Visit | Attending: Physician Assistant | Admitting: Physician Assistant

## 2019-07-23 DIAGNOSIS — Z1231 Encounter for screening mammogram for malignant neoplasm of breast: Secondary | ICD-10-CM | POA: Diagnosis not present

## 2019-08-08 ENCOUNTER — Encounter: Payer: Self-pay | Admitting: Family Medicine

## 2021-03-07 IMAGING — MG DIGITAL SCREENING BILAT W/ TOMO W/ CAD
8 series · 9 of 24 positions shown · non-contrast
Comparison: Previous exam(s).

CLINICAL DATA: Screening.

EXAM:
DIGITAL SCREENING BILATERAL MAMMOGRAM WITH TOMO AND CAD

[R CC synth-2D]
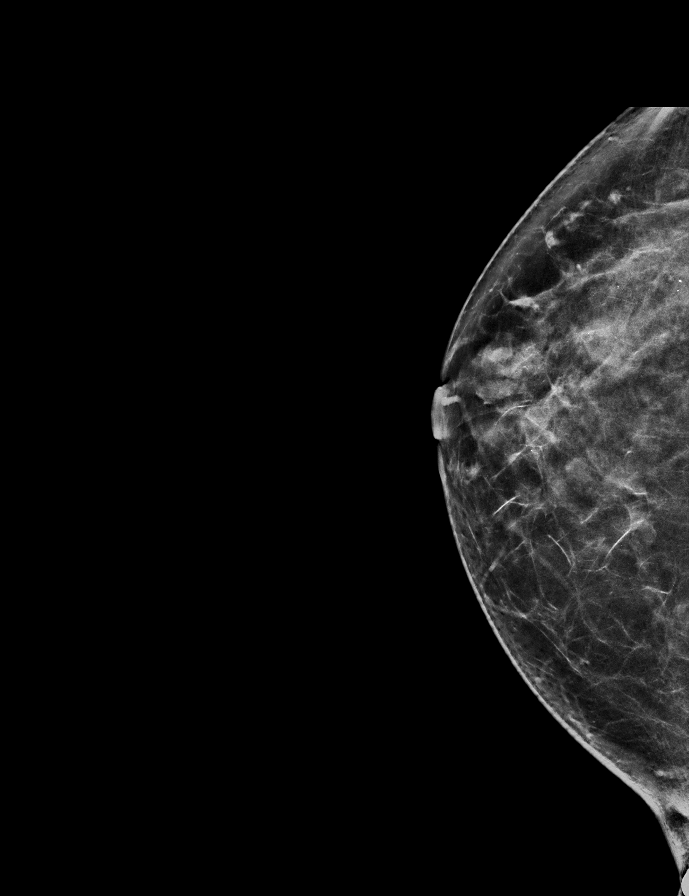

[L CC synth-2D]
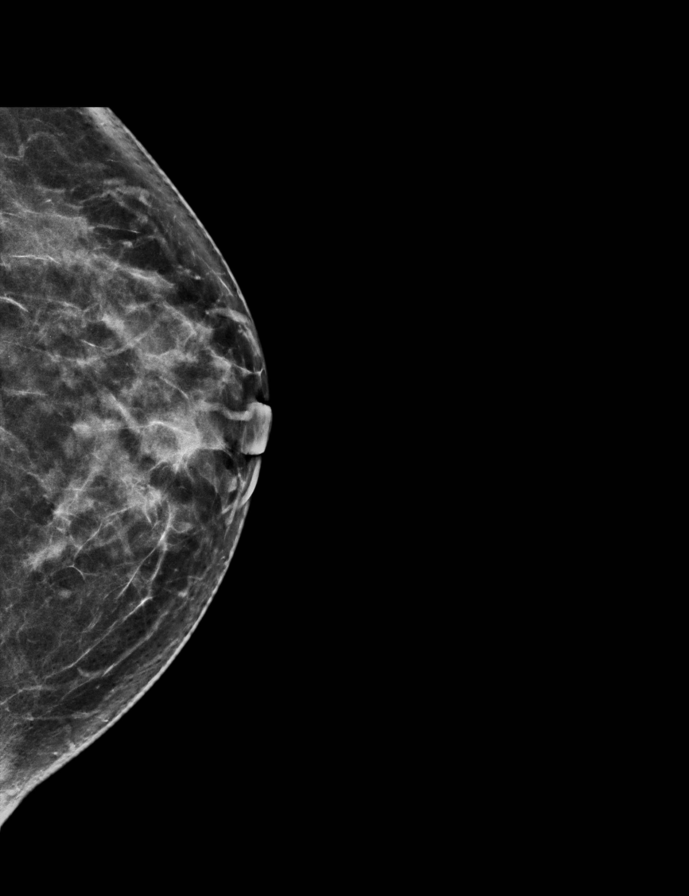

[L MLO synth-2D]
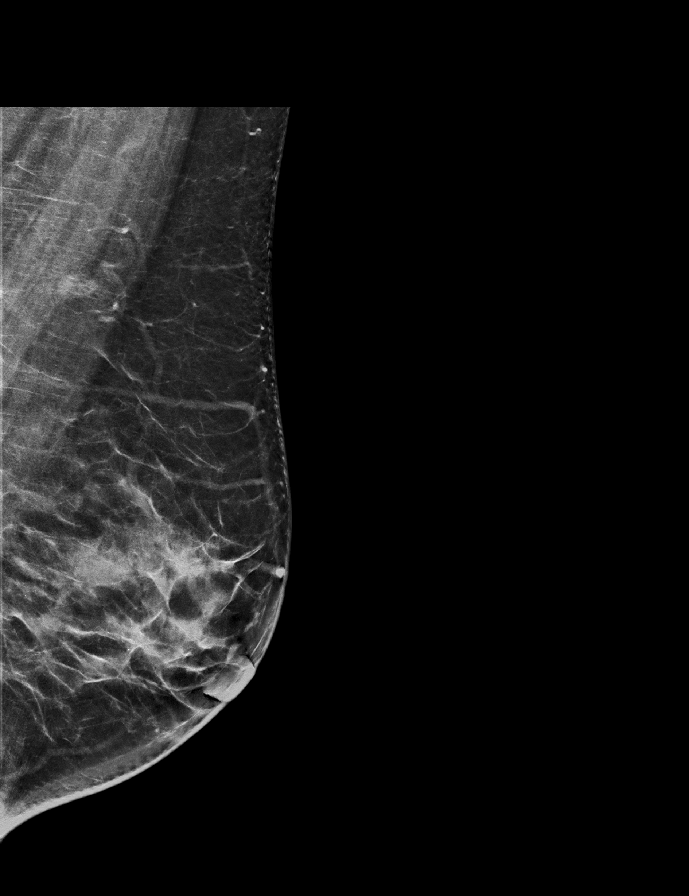

[R MLO synth-2D]
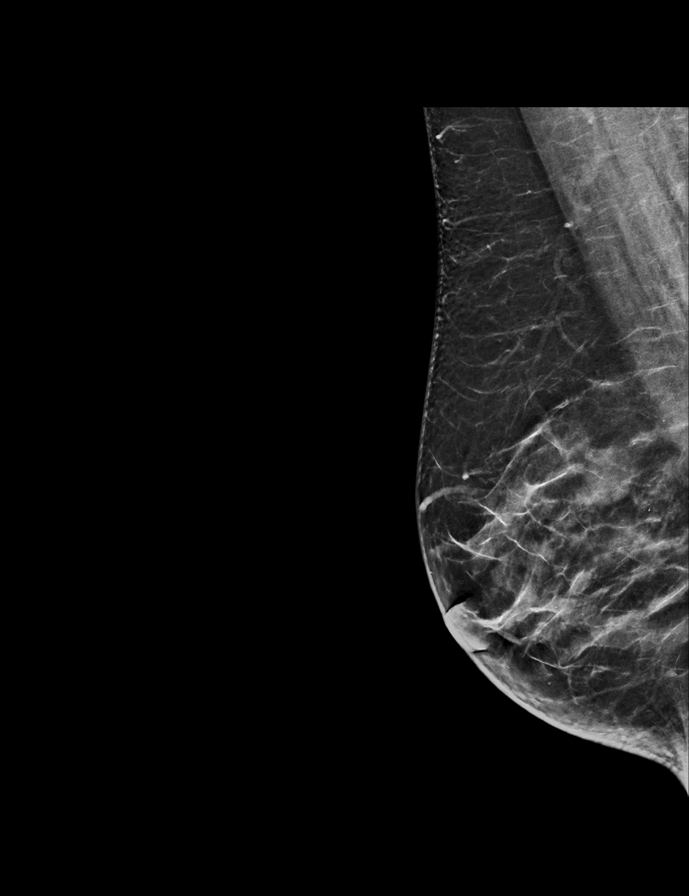

[R MLO tomo · 2 of 70 frames shown]
[frame 23/70]
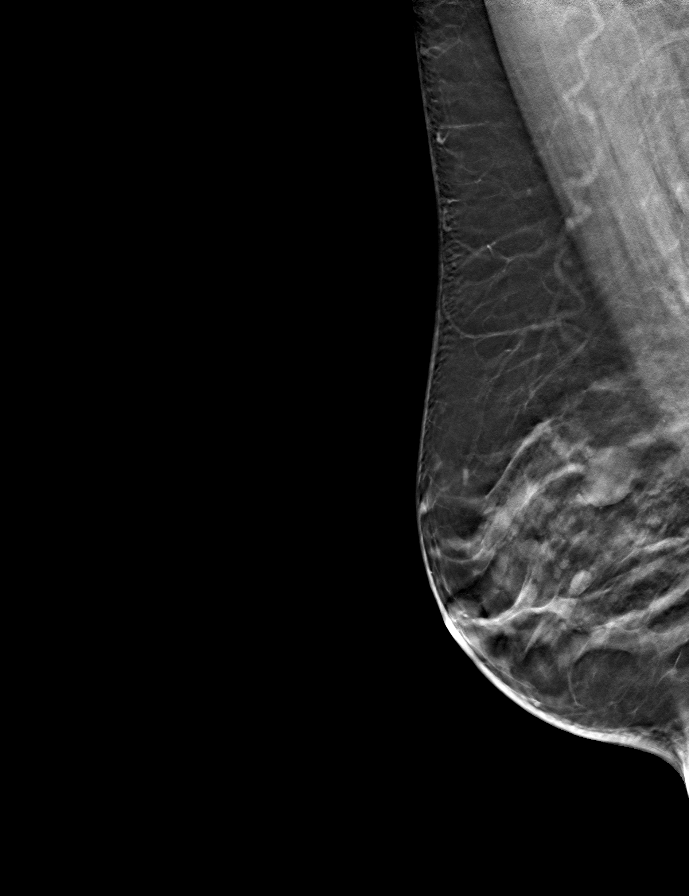
[frame 35/70]
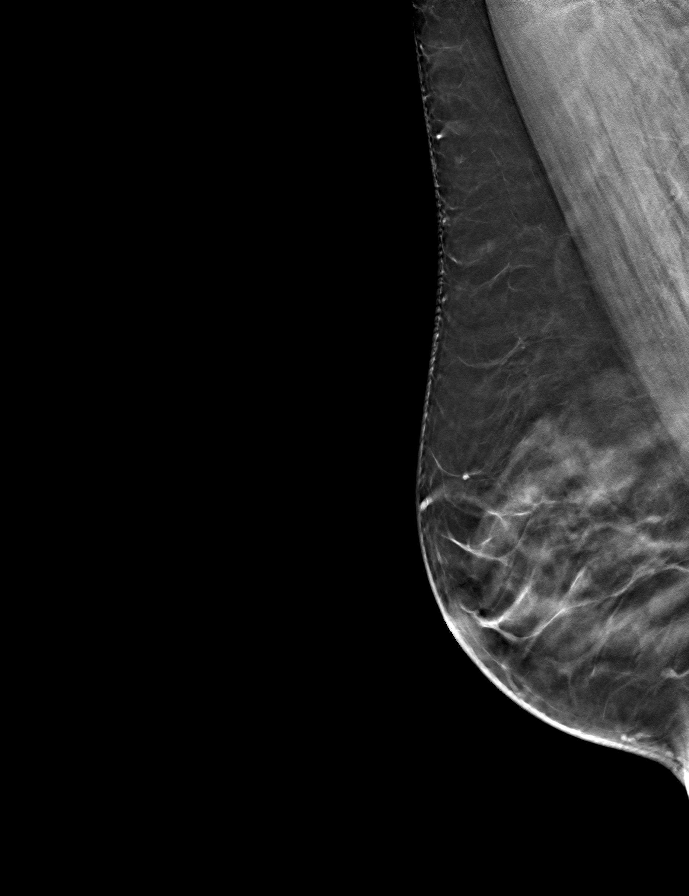

[L MLO tomo · tomo slice 36/71.0]
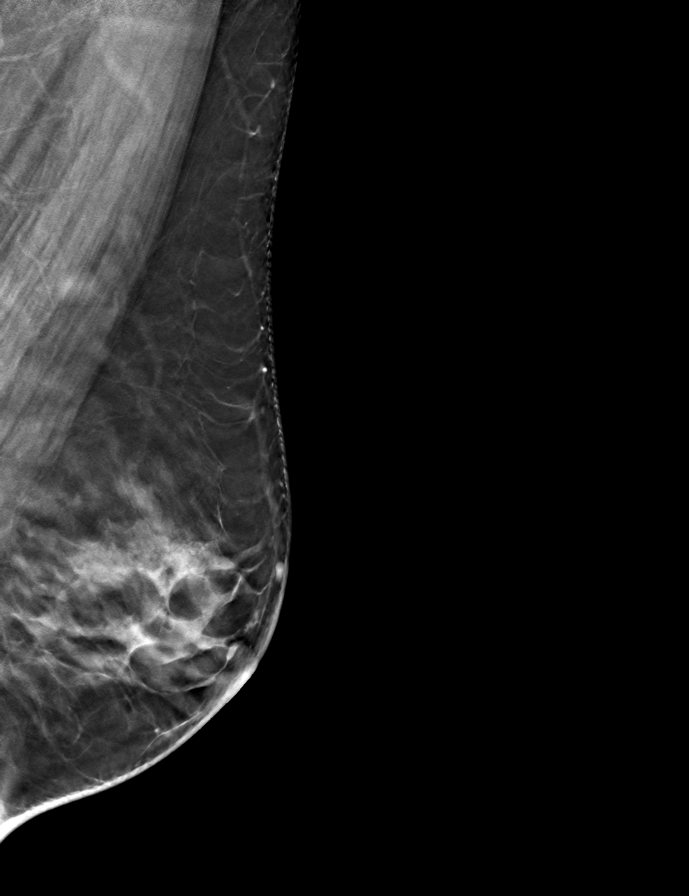

[L CC tomo · tomo slice 35/68.0]
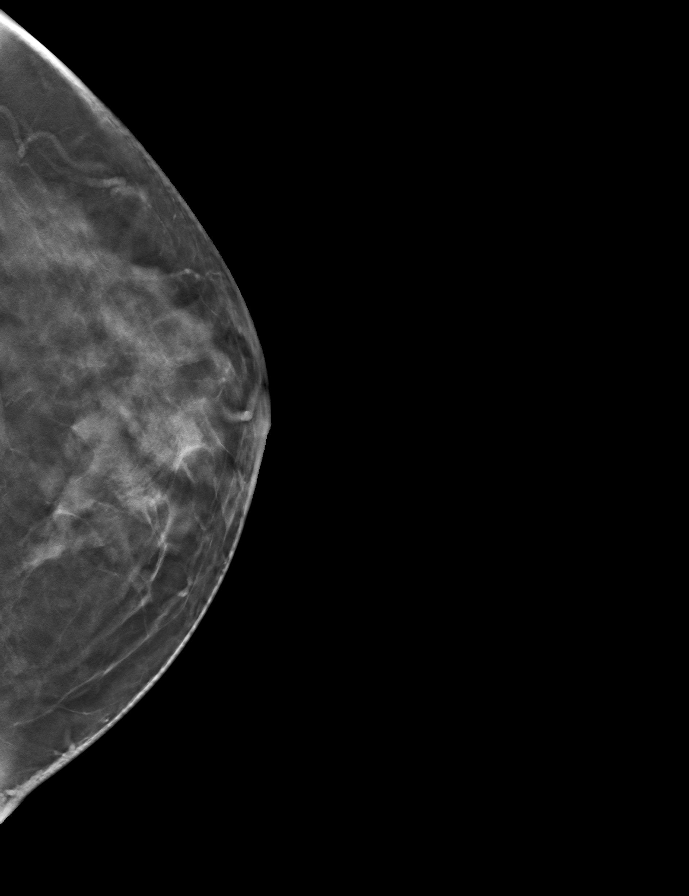

[R CC tomo · tomo slice 34/67.0]
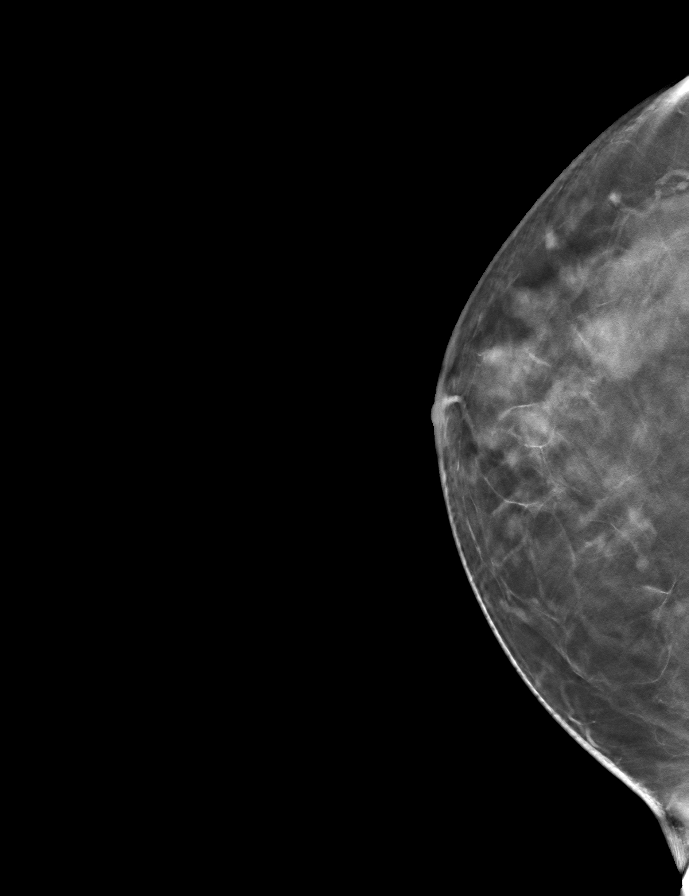

[9 of 24 positions shown; findings below may reference images not displayed]

ACR Breast Density Category c: The breast tissue is heterogeneously
dense, which may obscure small masses.
FINDINGS: There are no findings suspicious for malignancy. Images were
processed with CAD.
IMPRESSION: No mammographic evidence of malignancy. A result letter of this
screening mammogram will be mailed directly to the patient.

RECOMMENDATION:
Screening mammogram in one year. (Code:FT-U-LHB)

BI-RADS CATEGORY  1: Negative.

## 2021-08-07 ENCOUNTER — Other Ambulatory Visit: Payer: Self-pay

## 2021-08-07 ENCOUNTER — Ambulatory Visit
Admission: EM | Admit: 2021-08-07 | Discharge: 2021-08-07 | Disposition: A | Discharge status: Home / Self Care | Payer: BLUE CROSS/BLUE SHIELD | Attending: Family Medicine | Admitting: Family Medicine

## 2021-08-07 DIAGNOSIS — R1030 Lower abdominal pain, unspecified: Secondary | ICD-10-CM

## 2021-08-07 LAB — POCT URINALYSIS DIP (MANUAL ENTRY)
Bilirubin, UA: NEGATIVE
Blood, UA: NEGATIVE
Glucose, UA: NEGATIVE mg/dL
Leukocytes, UA: NEGATIVE
Nitrite, UA: NEGATIVE
Protein Ur, POC: NEGATIVE mg/dL
Spec Grav, UA: 1.025 (ref 1.010–1.025)
Urobilinogen, UA: 1 E.U./dL
pH, UA: 5 (ref 5.0–8.0)

## 2021-08-07 NOTE — ED Provider Notes (Signed)
[facility]    CSN: [id number] Arrival date & time: 08/07/21  1237      History   Chief Complaint Chief Complaint  Patient presents with   Abdominal Pain   Back Pain    HPI [name] is a 56 y.o. female.   Presenting today with about 3-week history of bilateral lower quadrant pain, suprapubic pain and now starting this morning pain wrapping around toward her right flank additionally, worse with certain movements.  She states she is also had some nausea and vomiting at certain times when she eats recently.  She denies fever, chills, upper abdominal pain, bowel movement changes, diarrhea, melena, history of GU issues.  No recent medication changes, diet or routine changes.  Not trying anything over-the-counter for symptoms.   Past Medical History:  Diagnosis Date   Acid reflux    Anemia    Hypertension    PONV (postoperative nausea and vomiting)     Patient Active Problem List   Diagnosis Date Noted   HTN (hypertension) 09/13/2011   Alcohol abuse, episodic 09/13/2011   Hyperlipemia 09/13/2011   GERD (gastroesophageal reflux disease) 09/13/2011   Anemia 09/13/2011    Past Surgical History:  Procedure Laterality Date   CESAREAN SECTION  x2   DILITATION & CURRETTAGE/HYSTROSCOPY WITH NOVASURE ABLATION N/A 07/10/2014   Procedure: DILATATION & CURETTAGE/ HYSTEROSCOPY/ENDOMETRIAL ABLATION WITH NOVASURE;  Surgeon: Lazaro Arms, MD;  Location: [facility];  Service: Gynecology;  Laterality: N/A;  6.5 cm length, 2.5 cm width, 89 power; 1 minute 31 seconds   TUBAL LIGATION      OB History     Gravida  3   Para  2   Term      Preterm      AB  1   Living         SAB  1   IAB      Ectopic      Multiple      Live Births               Home Medications    Prior to Admission medications   Medication Sig Start Date End Date Taking? Authorizing Provider  Zinc 20 MG CAPS Take by mouth.   Yes [provider]  ferrous sulfate (IRON  SUPPLEMENT) 325 (65 FE) MG tablet Take 325 mg by mouth 2 (two) times daily with a meal.    [provider]  lovastatin (MEVACOR) 20 MG tablet One po qd for cholesterol 07/30/16   Campbell Riches, NP  metoprolol succinate (TOPROL-XL) 50 MG 24 hr tablet TAKE ONE TABLET BY MOUTH ONCE DAILY WITH  OR  IMMEDIATELY  FOLLOWING  A  MEAL 07/09/16   Babs Sciara, MD  Multiple Vitamin (MULTIVITAMIN WITH MINERALS) TABS tablet Take 1 tablet by mouth daily.    [provider]  Multiple Vitamins-Minerals (HAIR/SKIN/NAILS) TABS Take 1 tablet by mouth daily.    [provider]  Omega-3 Fatty Acids (FISH OIL) 600 MG CAPS Take 1 capsule by mouth 2 (two) times daily.     [provider]  omeprazole (PRILOSEC) 20 MG capsule TAKE ONE CAPSULE BY MOUTH ONCE DAILY BEFORE BREAKFAST 07/09/16   Babs Sciara, MD  Vitamin D, Ergocalciferol, (DRISDOL) 50000 units CAPS capsule Take 1 capsule (50,000 Units total) by mouth every 7 (seven) days. 07/28/16   Campbell Riches, NP    Family History Family History  Problem Relation Age of Onset   Heart disease  Mother        MI   Cancer Mother 61       Lung; smoker   Heart disease Father        MI   Cancer Sister 66       BREAST    Social History Social History   Tobacco Use   Smoking status: Never   Smokeless tobacco: Never  Substance Use Topics   Alcohol use: Yes    Comment: RARE SOCIAL   Drug use: No     Allergies   Patient has no known allergies.   Review of Systems Review of Systems Per HPI  Physical Exam Triage Vital Signs ED Triage Vitals  Enc Vitals Group     BP 08/07/21 1246 131/84     Pulse Rate 08/07/21 1246 98     Resp 08/07/21 1246 18     Temp 08/07/21 1246 98 F (36.7 C)     Temp Source 08/07/21 1246 Oral     SpO2 08/07/21 1246 96 %     Weight --      Height --      Head Circumference --      Peak Flow --      Pain Score 08/07/21 1248 4     Pain Loc --      Pain Edu? --      Excl. in GC? --     No data found.  Updated Vital Signs BP 131/84 (BP Location: Right Arm)    Pulse 98    Temp 98 F (36.7 C) (Oral)    Resp 18    LMP  (Within Months) Comment: 2 months   SpO2 96%   Visual Acuity Right Eye Distance:   Left Eye Distance:   Bilateral Distance:    Right Eye Near:   Left Eye Near:    Bilateral Near:     Physical Exam Vitals and nursing note reviewed.  Constitutional:      Appearance: Normal appearance. She is not ill-appearing.  HENT:     Head: Atraumatic.     Mouth/Throat:     Mouth: Mucous membranes are moist.  Eyes:     Extraocular Movements: Extraocular movements intact.     Conjunctiva/sclera: Conjunctivae normal.  Cardiovascular:     Rate and Rhythm: Normal rate and regular rhythm.     Heart sounds: Normal heart sounds.  Pulmonary:     Effort: Pulmonary effort is normal.     Breath sounds: Normal breath sounds.  Abdominal:     General: Bowel sounds are normal. There is no distension.     Palpations: Abdomen is soft.     Tenderness: There is abdominal tenderness. There is no right CVA tenderness, left CVA tenderness or guarding.     Comments: Mild suprapubic tenderness to palpation without distention or guarding.  Negative McBurney's, no adnexal tenderness to palpation or masses palpable  Musculoskeletal:        General: Normal range of motion.     Cervical back: Normal range of motion and neck supple.  Skin:    General: Skin is warm and dry.  Neurological:     Mental Status: She is alert and oriented to person, place, and time.  Psychiatric:        Mood and Affect: Mood normal.        Thought Content: Thought content normal.        Judgment: Judgment normal.     UC Treatments / Results  Labs (  all labs ordered are listed, but only abnormal results are displayed) Labs Reviewed  POCT URINALYSIS DIP (MANUAL ENTRY) - Abnormal; Notable for the following components:      Result Value   Clarity, UA cloudy (*)    Ketones, POC UA trace (5) (*)     All other components within normal limits  CBC WITH DIFFERENTIAL/PLATELET  COMPREHENSIVE METABOLIC PANEL  LIPASE    EKG   Radiology No results found.  Procedures Procedures (including critical care time)  Medications Ordered in UC Medications - No data to display  Initial Impression / Assessment and Plan / UC Course  I have reviewed the triage vital signs and the nursing notes.  Pertinent labs & imaging results that were available during my care of the patient were reviewed by me and considered in my medical decision making (see chart for details).     Vital signs benign and reassuring, exam without red flag findings.  Urinalysis without evidence of kidney stone, urinary tract infection.  Will obtain basic labs for further safety check but discussed that she would likely require imaging for further evaluation given the persistence and location of her pain.  She plans to follow-up with primary care as soon as possible for further evaluation.  Knows to go to the emergency department if symptoms worsen at any time.  Final Clinical Impressions(s) / UC Diagnoses   Final diagnoses:  Lower abdominal pain     Discharge Instructions      Follow-up with your primary care provider soon as possible for further evaluation.  We will call you if your lab work is abnormal    ED Prescriptions   None    PDMP not reviewed this encounter.   Particia NearingLane, Ashlynne Shetterly [place], [place] 08/07/21 1534

## 2021-08-07 NOTE — ED Triage Notes (Signed)
Pt reports lower back pain, left lower quadrant abdominal pain x 3 weeks; right lower quadrant abdominal pain x 1 day. Pt reports sometimes she vomits when she has the back pain and eats. Pt reports she has fatty liver.

## 2021-08-07 NOTE — Discharge Instructions (Signed)
Follow-up with your primary care provider soon as possible for further evaluation.  We will call you if your lab work is abnormal

## 2021-08-08 LAB — COMPREHENSIVE METABOLIC PANEL
ALT: 42 IU/L — ABNORMAL HIGH (ref 0–32)
AST: 41 IU/L — ABNORMAL HIGH (ref 0–40)
Albumin/Globulin Ratio: 1.7 (ref 1.2–2.2)
Albumin: 4.6 g/dL (ref 3.8–4.9)
Alkaline Phosphatase: 82 IU/L (ref 44–121)
BUN/Creatinine Ratio: 23 (ref 9–23)
BUN: 14 mg/dL (ref 6–24)
Bilirubin Total: 0.2 mg/dL (ref 0.0–1.2)
CO2: 24 mmol/L (ref 20–29)
Calcium: 9.8 mg/dL (ref 8.7–10.2)
Chloride: 103 mmol/L (ref 96–106)
Creatinine, Ser: 0.62 mg/dL (ref 0.57–1.00)
Globulin, Total: 2.7 g/dL (ref 1.5–4.5)
Glucose: 109 mg/dL — ABNORMAL HIGH (ref 70–99)
Potassium: 4.2 mmol/L (ref 3.5–5.2)
Sodium: 143 mmol/L (ref 134–144)
Total Protein: 7.3 g/dL (ref 6.0–8.5)
eGFR: 106 mL/min/{1.73_m2} (ref >59)

## 2021-08-08 LAB — CBC WITH DIFFERENTIAL/PLATELET
Basophils Absolute: 0.1 10*3/uL (ref 0.0–0.2)
Basos: 1 %
EOS (ABSOLUTE): 0.2 10*3/uL (ref 0.0–0.4)
Eos: 2 %
Hematocrit: 42.1 % (ref 34.0–46.6)
Hemoglobin: 14.5 g/dL (ref 11.1–15.9)
Immature Grans (Abs): 0 10*3/uL (ref 0.0–0.1)
Immature Granulocytes: 0 %
Lymphocytes Absolute: 2.8 10*3/uL (ref 0.7–3.1)
Lymphs: 37 %
MCH: 30.2 pg (ref 26.6–33.0)
MCHC: 34.4 g/dL (ref 31.5–35.7)
MCV: 88 fL (ref 79–97)
Monocytes Absolute: 0.6 10*3/uL (ref 0.1–0.9)
Monocytes: 9 %
Neutrophils Absolute: 3.8 10*3/uL (ref 1.4–7.0)
Neutrophils: 51 %
Platelets: 288 10*3/uL (ref 150–450)
RBC: 4.8 x10E6/uL (ref 3.77–5.28)
RDW: 11.8 % (ref 11.7–15.4)
WBC: 7.5 10*3/uL (ref 3.4–10.8)

## 2021-08-08 LAB — LIPASE: Lipase: 78 U/L — ABNORMAL HIGH (ref 14–72)

## 2023-11-17 ENCOUNTER — Emergency Department (HOSPITAL_COMMUNITY): Payer: Self-pay

## 2023-11-17 ENCOUNTER — Emergency Department (HOSPITAL_COMMUNITY)
Admission: EM | Admit: 2023-11-17 | Discharge: 2023-11-17 | Disposition: A | Discharge status: Home / Self Care | Payer: Self-pay | Attending: Emergency Medicine | Admitting: Emergency Medicine

## 2023-11-17 ENCOUNTER — Other Ambulatory Visit: Payer: Self-pay

## 2023-11-17 DIAGNOSIS — Z79899 Other long term (current) drug therapy: Secondary | ICD-10-CM | POA: Insufficient documentation

## 2023-11-17 DIAGNOSIS — I10 Essential (primary) hypertension: Secondary | ICD-10-CM | POA: Insufficient documentation

## 2023-11-17 LAB — COMPREHENSIVE METABOLIC PANEL WITH GFR
ALT: 26 U/L (ref 0–44)
AST: 31 U/L (ref 15–41)
Albumin: 3.8 g/dL (ref 3.5–5.0)
Alkaline Phosphatase: 78 U/L (ref 38–126)
Anion gap: 11 (ref 5–15)
BUN: 9 mg/dL (ref 6–20)
CO2: 24 mmol/L (ref 22–32)
Calcium: 9.2 mg/dL (ref 8.9–10.3)
Chloride: 101 mmol/L (ref 98–111)
Creatinine, Ser: 0.59 mg/dL (ref 0.44–1.00)
GFR, Estimated: >60 mL/min (ref >60)
Glucose, Bld: 115 mg/dL — ABNORMAL HIGH (ref 70–99)
Potassium: 3.7 mmol/L (ref 3.5–5.1)
Sodium: 136 mmol/L (ref 135–145)
Total Bilirubin: 0.7 mg/dL (ref 0.0–1.2)
Total Protein: 7.6 g/dL (ref 6.5–8.1)

## 2023-11-17 LAB — URINALYSIS, ROUTINE W REFLEX MICROSCOPIC
Bilirubin Urine: NEGATIVE
Glucose, UA: NEGATIVE mg/dL
Hgb urine dipstick: NEGATIVE
Ketones, ur: NEGATIVE mg/dL
Leukocytes,Ua: NEGATIVE
Nitrite: NEGATIVE
Protein, ur: NEGATIVE mg/dL
Specific Gravity, Urine: 1.004 — ABNORMAL LOW (ref 1.005–1.030)
pH: 7 (ref 5.0–8.0)

## 2023-11-17 LAB — I-STAT CHEM 8, ED
BUN: 8 mg/dL (ref 6–20)
Calcium, Ion: 1.17 mmol/L (ref 1.15–1.40)
Chloride: 101 mmol/L (ref 98–111)
Creatinine, Ser: 0.6 mg/dL (ref 0.44–1.00)
Glucose, Bld: 111 mg/dL — ABNORMAL HIGH (ref 70–99)
HCT: 43 % (ref 36.0–46.0)
Hemoglobin: 14.6 g/dL (ref 12.0–15.0)
Potassium: 3.8 mmol/L (ref 3.5–5.1)
Sodium: 140 mmol/L (ref 135–145)
TCO2: 26 mmol/L (ref 22–32)

## 2023-11-17 LAB — CBC WITH DIFFERENTIAL/PLATELET
Abs Immature Granulocytes: 0.03 10*3/uL (ref 0.00–0.07)
Basophils Absolute: 0.1 10*3/uL (ref 0.0–0.1)
Basophils Relative: 1 %
Eosinophils Absolute: 0.2 10*3/uL (ref 0.0–0.5)
Eosinophils Relative: 2 %
HCT: 42.5 % (ref 36.0–46.0)
Hemoglobin: 14.8 g/dL (ref 12.0–15.0)
Immature Granulocytes: 0 %
Lymphocytes Relative: 34 %
Lymphs Abs: 2.4 10*3/uL (ref 0.7–4.0)
MCH: 29.5 pg (ref 26.0–34.0)
MCHC: 34.8 g/dL (ref 30.0–36.0)
MCV: 84.8 fL (ref 80.0–100.0)
Monocytes Absolute: 0.6 10*3/uL (ref 0.1–1.0)
Monocytes Relative: 8 %
Neutro Abs: 3.9 10*3/uL (ref 1.7–7.7)
Neutrophils Relative %: 55 %
Platelets: 284 10*3/uL (ref 150–400)
RBC: 5.01 MIL/uL (ref 3.87–5.11)
RDW: 11.9 % (ref 11.5–15.5)
WBC: 7.1 10*3/uL (ref 4.0–10.5)
nRBC: 0 % (ref 0.0–0.2)

## 2023-11-17 LAB — TROPONIN I (HIGH SENSITIVITY)
Troponin I (High Sensitivity): 2 ng/L (ref <18)
Troponin I (High Sensitivity): 2 ng/L (ref <18)

## 2023-11-17 MED ORDER — AMLODIPINE BESYLATE 5 MG PO TABS
5.0000 mg | ORAL_TABLET | Freq: Once | ORAL | Status: AC
  Administered 2023-11-17: 5 mg via ORAL
  Filled 2023-11-17: qty 1

## 2023-11-17 MED ORDER — AMLODIPINE BESYLATE 5 MG PO TABS: 5.0000 mg | ORAL_TABLET | Freq: Every day | ORAL | 1 refills | Status: AC

## 2023-11-17 NOTE — ED Provider Notes (Signed)
 [place] [facility] AT [facility] Provider Note   CSN: [id number] Arrival date & time: 11/17/23  0830     History  Chief Complaint  Patient presents with   Hypertension    Cassandra Bennett is a 58 y.o. female.  HPI 58 year old female presents with multiple complaints and hypertension.  Patient states about a year or more ago she stopped taking her blood pressure for hypertension, though this was a decision she made herself.  She states that over the last 3 weeks has been having some visual complaints which include seeing squiggly lines in both eyes.  They seem to move.  Sometimes she will get pressure in her eyes and has been having a headache that is mild to moderate for the past week or so.  Denies any weakness or numbness in her extremities.  She has been having some chest pain on and off that is sharp in nature and a little right of the midline.  Most recently this occurred prior to me seeing her while in the room.  No shortness of breath.  No abdominal pain.  She feels like she urinates a lot.  She has not checked her blood pressure in a long time but checked it this morning and it was around 200/111.  Home Medications Prior to Admission medications   Medication Sig Start Date End Date Taking? Authorizing Provider  amLODipine (NORVASC) 5 MG tablet Take 1 tablet (5 mg total) by mouth daily. 11/17/23  Yes Jerilynn Montenegro, MD  ferrous sulfate (IRON SUPPLEMENT) 325 (65 FE) MG tablet Take 325 mg by mouth 2 (two) times daily with a meal. Patient not taking: Reported on 11/17/2023    [provider]  lovastatin  (MEVACOR ) 20 MG tablet One po qd for cholesterol Patient not taking: Reported on 11/17/2023 07/30/16   Derenda Flax, NP  metoprolol  succinate (TOPROL -XL) 50 MG 24 hr tablet TAKE ONE TABLET BY MOUTH ONCE DAILY WITH  OR  IMMEDIATELY  FOLLOWING  A  MEAL Patient not taking: Reported on 11/17/2023 07/09/16   Bennet Brasil, MD  Multiple Vitamin  (MULTIVITAMIN WITH MINERALS) TABS tablet Take 1 tablet by mouth daily. Patient not taking: Reported on 11/17/2023    [provider]  Multiple Vitamins-Minerals (HAIR/SKIN/NAILS) TABS Take 1 tablet by mouth daily. Patient not taking: Reported on 11/17/2023    [provider]  Omega-3 Fatty Acids (FISH OIL) 600 MG CAPS Take 1 capsule by mouth 2 (two) times daily.  Patient not taking: Reported on 11/17/2023    [provider]  omeprazole  (PRILOSEC) 20 MG capsule TAKE ONE CAPSULE BY MOUTH ONCE DAILY BEFORE BREAKFAST Patient not taking: Reported on 11/17/2023 07/09/16   Bennet Brasil, MD  Vitamin D , Ergocalciferol , (DRISDOL ) 50000 units CAPS capsule Take 1 capsule (50,000 Units total) by mouth every 7 (seven) days. Patient not taking: Reported on 11/17/2023 07/28/16   Derenda Flax, NP  Zinc 20 MG CAPS Take by mouth. Patient not taking: Reported on 11/17/2023    [provider]      Allergies    Patient has no known allergies.    Review of Systems   Review of Systems  Eyes:  Positive for visual disturbance.  Respiratory:  Negative for shortness of breath.   Cardiovascular:  Positive for chest pain.  Gastrointestinal:  Negative for abdominal pain.  Neurological:  Positive for headaches. Negative for weakness and numbness.    Physical Exam Updated Vital Signs BP (!) 195/101  Pulse 76   Temp 98.4 F (36.9 C) (Oral)   Resp 16   SpO2 96%  Physical Exam Vitals and nursing note reviewed.  Constitutional:      Appearance: She is well-developed.  HENT:     Head: Normocephalic and atraumatic.  Eyes:     Extraocular Movements: Extraocular movements intact.     Pupils: Pupils are equal, round, and reactive to light.     Comments: Normal visual fields  Cardiovascular:     Rate and Rhythm: Normal rate and regular rhythm.     Heart sounds: Normal heart sounds.  Pulmonary:     Effort: Pulmonary effort is normal.     Breath sounds: Normal breath sounds.   Abdominal:     Palpations: Abdomen is soft.     Tenderness: There is no abdominal tenderness.  Musculoskeletal:     Cervical back: No rigidity.  Skin:    General: Skin is warm and dry.  Neurological:     Mental Status: She is alert.     Comments: CN 3-12 grossly intact. 5/5 strength in all 4 extremities. Grossly normal sensation. Normal finger to nose.      ED Results / Procedures / Treatments   Labs (all labs ordered are listed, but only abnormal results are displayed) Labs Reviewed  COMPREHENSIVE METABOLIC PANEL WITH GFR - Abnormal; Notable for the following components:      Result Value   Glucose, Bld 115 (*)    All other components within normal limits  URINALYSIS, ROUTINE W REFLEX MICROSCOPIC - Abnormal; Notable for the following components:   Color, Urine STRAW (*)    Specific Gravity, Urine 1.004 (*)    All other components within normal limits  I-STAT CHEM 8, ED - Abnormal; Notable for the following components:   Glucose, Bld 111 (*)    All other components within normal limits  CBC WITH DIFFERENTIAL/PLATELET  TROPONIN I (HIGH SENSITIVITY)  TROPONIN I (HIGH SENSITIVITY)    EKG EKG Interpretation Date/Time:  Thursday Nov 17 2023 08:51:51 EDT Ventricular Rate:  73 PR Interval:  206 QRS Duration:  97 QT Interval:  414 QTC Calculation: 457 R Axis:   38  Text Interpretation: Sinus rhythm Borderline prolonged PR interval no significant change since 2015 Confirmed by Jerilynn Montenegro [phone] on 11/17/2023 9:07:13 AM  Radiology CT Head Wo Contrast Result Date: 11/17/2023 CLINICAL DATA:  58 year old female is hypertensive, systolic in the 200s. Headache. EXAM: CT HEAD WITHOUT CONTRAST TECHNIQUE: Contiguous axial images were obtained from the base of the skull through the vertex without intravenous contrast. RADIATION DOSE REDUCTION: This exam was performed according to the departmental dose-optimization program which includes automated exposure control, adjustment of the  mA and/or kV according to patient size and/or use of iterative reconstruction technique. COMPARISON:  None Available. FINDINGS: Brain: Normal cerebral volume. No midline shift, ventriculomegaly, mass effect, evidence of mass lesion, intracranial hemorrhage or evidence of cortically based acute infarction. Gray-white matter differentiation is within normal limits throughout the brain. Vascular: No suspicious intracranial vascular hyperdensity. Minimal calcified atherosclerosis at the skull base. Skull: Intact, negative. Sinuses/Orbits: Visualized paranasal sinuses and mastoids are well aerated. Other: Visualized orbits and scalp soft tissues are within normal limits. IMPRESSION: Normal for age noncontrast Head CT. Electronically Signed   By: Marlise Simpers M.D.   On: 11/17/2023 10:12   DG Chest 2 View Result Date: 11/17/2023 CLINICAL DATA:  Chest pain, hypertension EXAM: CHEST - 2 VIEW COMPARISON:  None Available. FINDINGS: The heart size and  mediastinal contours are within normal limits. Both lungs are clear. The visualized skeletal structures are unremarkable. IMPRESSION: No active cardiopulmonary disease. Electronically Signed   By: Fredrich Jefferson M.D.   On: 11/17/2023 09:48    Procedures Procedures    Medications Ordered in ED Medications  amLODipine (NORVASC) tablet 5 mg (5 mg Oral Given 11/17/23 0951)  amLODipine (NORVASC) tablet 5 mg (5 mg Oral Given 11/17/23 1206)    ED Course/ Medical Decision Making/ A&P                                 Medical Decision Making Amount and/or Complexity of Data Reviewed Labs: ordered.    Details: Normal troponin x 2. Radiology: ordered and independent interpretation performed.    Details: No head bleed ECG/medicine tests: ordered and independent interpretation performed.    Details: No ischemia.  Risk Prescription drug management.   Patient presents with hypertension and a mild headache.  She is also been having vision complaints with zigzags on and off  in both eyes for the last 3 weeks.  Does not sound like a retinal detachment.  Otherwise, patient has had some atypical on and off chest pain but has 2 negative troponins and an unremarkable EKG.  Head CT is unremarkable.  No neurodeficits.  Was given some amlodipine and BP is slowly improving.  His recent BP prior to discharge was in the 170s.  She will be prescribed amlodipine for discharge and I have strongly recommended she follow-up with a PCP which she agrees with.  Will also refer to ophthalmology for the eye complaints.  Otherwise, will discharge home with return precautions.        Final Clinical Impression(s) / ED Diagnoses Final diagnoses:  Uncontrolled hypertension    Rx / DC Orders ED Discharge Orders          Ordered    amLODipine (NORVASC) 5 MG tablet  Daily        11/17/23 1320              Jerilynn Montenegro, MD 11/17/23 1336

## 2023-11-17 NOTE — ED Triage Notes (Signed)
 Pt states "I think my BP is acting up, last time I took it at home it was 200s/100s. Pt states she does have a Hx of HTN but stopped taking her BP medication a long time ago, states she took herself off of them. Also c/o of "blurry zigg zaggs in both eyes and a HA x several weeks" and CP lastnight mid, but denies CP and denies SHOB at this time. Rates pain 3/10 HA. EDP made aware

## 2023-11-17 NOTE — Discharge Instructions (Addendum)
 We are placing you on blood pressure medicine.  You were given your first dose in the emergency department.  Start the prescription tomorrow.  Follow-up with your primary care physician and we are also referring you to an eye specialist for your vision changes.  If you develop new or worsening headache, chest pain, or any other new/concerning symptoms then return to the ER.
